# Patient Record
Sex: Female | Born: 1995 | Race: Black or African American | Hispanic: No | Smoking: Never smoker
Health system: Southern US, Community
[De-identification: ages and names within clinical notes are randomized; demographics above are authoritative.]

## PROBLEM LIST (undated history)

## (undated) DIAGNOSIS — U071 COVID-19: Secondary | ICD-10-CM

## (undated) DIAGNOSIS — Z789 Other specified health status: Secondary | ICD-10-CM

## (undated) DIAGNOSIS — F319 Bipolar disorder, unspecified: Secondary | ICD-10-CM

## (undated) DIAGNOSIS — F419 Anxiety disorder, unspecified: Secondary | ICD-10-CM

## (undated) HISTORY — PX: TONSILLECTOMY: SUR1361

## (undated) HISTORY — PX: NO PAST SURGERIES: SHX2092

---
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
## 2017-05-02 ENCOUNTER — Other Ambulatory Visit: Payer: Self-pay

## 2017-05-02 ENCOUNTER — Encounter: Payer: Self-pay | Admitting: Gynecology

## 2017-05-02 ENCOUNTER — Ambulatory Visit
Admission: EM | Admit: 2017-05-02 | Discharge: 2017-05-02 | Disposition: A | Discharge status: Home / Self Care | Payer: Medicaid Other | Attending: Emergency Medicine | Admitting: Emergency Medicine

## 2017-05-02 DIAGNOSIS — J029 Acute pharyngitis, unspecified: Secondary | ICD-10-CM | POA: Diagnosis not present

## 2017-05-02 DIAGNOSIS — F419 Anxiety disorder, unspecified: Secondary | ICD-10-CM | POA: Diagnosis not present

## 2017-05-02 DIAGNOSIS — F319 Bipolar disorder, unspecified: Secondary | ICD-10-CM | POA: Diagnosis not present

## 2017-05-02 HISTORY — DX: Bipolar disorder, unspecified: F31.9

## 2017-05-02 HISTORY — DX: Anxiety disorder, unspecified: F41.9

## 2017-05-02 LAB — CBC WITH DIFFERENTIAL/PLATELET
Basophils Absolute: 0.1 10*3/uL (ref 0–0.1)
Basophils Relative: 1 %
EOS ABS: 0.1 10*3/uL (ref 0–0.7)
EOS PCT: 1 %
HCT: 40.6 % (ref 35.0–47.0)
Hemoglobin: 13.8 g/dL (ref 12.0–16.0)
LYMPHS ABS: 1.8 10*3/uL (ref 1.0–3.6)
Lymphocytes Relative: 14 %
MCH: 27.9 pg (ref 26.0–34.0)
MCHC: 33.8 g/dL (ref 32.0–36.0)
MCV: 82.6 fL (ref 80.0–100.0)
MONO ABS: 0.9 10*3/uL (ref 0.2–0.9)
MONOS PCT: 7 %
Neutro Abs: 10.5 10*3/uL — ABNORMAL HIGH (ref 1.4–6.5)
Neutrophils Relative %: 77 %
PLATELETS: 347 10*3/uL (ref 150–440)
RBC: 4.92 MIL/uL (ref 3.80–5.20)
RDW: 13.3 % (ref 11.5–14.5)
WBC: 13.4 10*3/uL — AB (ref 3.6–11.0)

## 2017-05-02 LAB — MONONUCLEOSIS SCREEN: MONO SCREEN: NEGATIVE

## 2017-05-02 LAB — RAPID STREP SCREEN (MED CTR MEBANE ONLY): Streptococcus, Group A Screen (Direct): NEGATIVE

## 2017-05-02 MED ORDER — IBUPROFEN 600 MG PO TABS: 600.0000 mg | ORAL_TABLET | Freq: Four times a day (QID) | ORAL | 0 refills | Status: DC | PRN

## 2017-05-02 NOTE — ED Provider Notes (Signed)
HPI  SUBJECTIVE:  Patient reports sore throat starting 1 week ago. Sx worse with swallowing.  Sx better with nothing. Finished Augmentin several days ago for sinusitis.  Has also tried salt water gargles. 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  No history of mono, diabetes, hypertension, HIV.  LMP: At the end of February.  Denies the possibility being pregnant.  PMD: [facility].   Past Medical History:  Diagnosis Date  . Anxiety   . Bipolar 1 disorder (HCC)     History reviewed. No pertinent surgical history.  History reviewed. No pertinent family history.  Social History   Tobacco Use  . Smoking status: Never Smoker  . Smokeless tobacco: Never Used  Substance Use Topics  . Alcohol use: No    Frequency: Never  . Drug use: No    No current facility-administered medications for this encounter.   Current Outpatient Medications:  .  lamoTRIgine (LAMICTAL) 100 MG tablet, Take 100 mg by mouth daily., Disp: , Rfl:  .  sertraline (ZOLOFT) 100 MG tablet, Take 100 mg by mouth daily., Disp: , Rfl:  .  ibuprofen (ADVIL,MOTRIN) 600 MG tablet, Take 1 tablet (600 mg total) by mouth every 6 (six) hours as needed., Disp: 30 tablet, Rfl: 0  No Known Allergies   ROS  As noted in HPI.   Physical Exam  BP 140/89 (BP Location: Right Arm)   Pulse 94   Temp 98.8 F (37.1 C) (Oral)   Resp 16   Wt 177 lb (80.3 kg)   LMP 04/21/2017   SpO2 99%   Constitutional: Well developed, well nourished, no acute distress Eyes:  EOMI, conjunctiva normal bilaterally HENT: Normocephalic, atraumatic,mucus membranes moist.  - nasal congestion +erythematous oropharynx +enlarged tonsils + exudates.  Uvula midline.  Respiratory: Normal inspiratory effort Cardiovascular: Normal rate, no murmurs, rubs, gallops GI: nondistended, nontender. No appreciable splenomegaly skin: No rash, skin intact Lymph: + cervical LN  Musculoskeletal: no deformities Neurologic: Alert & oriented x 3, no focal neuro deficits Psychiatric: Speech and behavior appropriate.   ED Course   Medications - No data to display  Orders Placed This Encounter  Procedures  . Rapid strep screen    Standing Status:   Standing    Number of Occurrences:   1  . Culture, group A strep    Standing Status:   Standing    Number of Occurrences:   1  . CBC with Differential    Standing Status:   Standing    Number of Occurrences:   1  . Mononucleosis screen    Standing Status:   Standing    Number of Occurrences:   1    Results for orders placed or performed during the hospital encounter of 05/02/17 (from the past 24 hour(s))  Rapid strep screen     Status: None   Collection Time: 05/02/17  1:38 PM  Result Value Ref Range   Streptococcus, Group A Screen (Direct) NEGATIVE NEGATIVE  CBC with Differential     Status: Abnormal   Collection Time: 05/02/17  4:04 PM  Result Value Ref Range   WBC 13.4 (H) 3.6 - 11.0 K/uL  RBC 4.92 3.80 - 5.20 MIL/uL   Hemoglobin 13.8 12.0 - 16.0 g/dL   HCT 96.040.6 45.435.0 - 09.847.0 %   MCV 82.6 80.0 - 100.0 fL   MCH 27.9 26.0 - 34.0 pg   MCHC 33.8 32.0 - 36.0 g/dL   RDW 11.913.3 14.711.5 - 82.914.5 %   Platelets 347 150 - 440 K/uL   Neutrophils Relative % 77 %   Neutro Abs 10.5 (H) 1.4 - 6.5 K/uL   Lymphocytes Relative 14 %   Lymphs Abs 1.8 1.0 - 3.6 K/uL   Monocytes Relative 7 %   Monocytes Absolute 0.9 0.2 - 0.9 K/uL   Eosinophils Relative 1 %   Eosinophils Absolute 0.1 0 - 0.7 K/uL   Basophils Relative 1 %   Basophils Absolute 0.1 0 - 0.1 K/uL  Mononucleosis screen     Status: None   Collection Time: 05/02/17  4:04 PM  Result Value Ref Range   Mono Screen NEGATIVE NEGATIVE   No results  found.  ED Clinical Impression  Exudative pharyngitis   ED Assessment/Plan  Rapid strep negative.  She has an exudative pharyngitis so we will check a CBC, Monospot.  Also sending off a throat culture.  Anticipate this to be negative because she just finished a course of Augmentin.  we will contact to the patient and call in the appropriate antibiotics if this comes back positive for infection requiring antibiotics.  Patient home with ibuprofen, Tylenol, Benadryl/Maalox mixture. Patient to followup with PMD if not better in a week.  The ER if she gets worse.  With a leukocytosis however Monospot is negative.  Patient with an exudative pharyngitis, unknown etiology.  Plan as above.    Discussed labs,  MDM, plan and followup with patient.  patient agrees with plan.   Meds ordered this encounter  Medications  . ibuprofen (ADVIL,MOTRIN) 600 MG tablet    Sig: Take 1 tablet (600 mg total) by mouth every 6 (six) hours as needed.    Dispense:  30 tablet    Refill:  0     *This clinic note was created using Scientist, clinical (histocompatibility and immunogenetics)Dragon dictation software. Therefore, there may be occasional mistakes despite careful proofreading.    Domenick GongMortenson, Leeana Creer, MD 05/03/17 [phone]

## 2017-05-02 NOTE — Discharge Instructions (Signed)
your rapid strep was negative today, so we have sent off a throat culture.  We will contact you and call in the appropriate antibiotics if your culture comes back positive for an infection requiring antibiotic treatment.  Give us a working phone number.  Your Monospot also came back negative, which is surprising.  1 gram of Tylenol and 600 mg ibuprofen together 3-4 times a day as needed for pain.  Make sure you drink plenty of extra fluids.  Some people find salt water gargles and  Traditional Medicinal's "Throat Coat" tea helpful. Take 5 mL of liquid Benadryl and 5 mL of Maalox. Mix it together, and then hold it in your mouth for as long as you can and then swallow. You may do this 4 times a day.   Follow-up with your primary care physician if you are not better in a week to retest for mono.  Go immediately to the ER if you get worse, have difficulty breathing, cannot swallow your saliva because your throat is swelling shut, or for other concerns.  Go to www.goodrx.com to look up your medications. This will give you a list of where you can find your prescriptions at the most affordable prices. Or ask the pharmacist what the cash price is, or if they have any other discount programs available to help make your medication more affordable. This can be less expensive than what you would pay with insurance.

## 2017-05-02 NOTE — ED Triage Notes (Signed)
Per patient sore throat x over 1 week.

## 2017-05-05 LAB — CULTURE, GROUP A STREP (THRC)

## 2018-12-29 ENCOUNTER — Other Ambulatory Visit: Payer: Self-pay | Admitting: *Deleted

## 2018-12-29 DIAGNOSIS — Z20822 Contact with and (suspected) exposure to covid-19: Secondary | ICD-10-CM

## 2018-12-30 LAB — NOVEL CORONAVIRUS, NAA: SARS-CoV-2, NAA: NOT DETECTED

## 2019-07-09 LAB — OB RESULTS CONSOLE HIV ANTIBODY (ROUTINE TESTING): HIV: NONREACTIVE

## 2019-07-09 LAB — OB RESULTS CONSOLE GBS: GBS: POSITIVE

## 2019-07-09 LAB — OB RESULTS CONSOLE GC/CHLAMYDIA
Chlamydia: NEGATIVE
Gonorrhea: NEGATIVE

## 2019-07-09 LAB — OB RESULTS CONSOLE RPR: RPR: NONREACTIVE

## 2020-01-22 DIAGNOSIS — O0993 Supervision of high risk pregnancy, unspecified, third trimester: Secondary | ICD-10-CM | POA: Insufficient documentation

## 2020-01-26 LAB — OB RESULTS CONSOLE HEPATITIS B SURFACE ANTIGEN: Hepatitis B Surface Ag: NEGATIVE

## 2020-01-26 LAB — OB RESULTS CONSOLE VARICELLA ZOSTER ANTIBODY, IGG: Varicella: NON-IMMUNE/NOT IMMUNE

## 2020-01-26 LAB — OB RESULTS CONSOLE RUBELLA ANTIBODY, IGM: Rubella: IMMUNE

## 2020-02-09 ENCOUNTER — Encounter: Payer: Self-pay | Admitting: Emergency Medicine

## 2020-02-09 ENCOUNTER — Ambulatory Visit
Admission: EM | Admit: 2020-02-09 | Discharge: 2020-02-09 | Disposition: A | Discharge status: Home / Self Care | Payer: Medicaid Other | Attending: Sports Medicine | Admitting: Sports Medicine

## 2020-02-09 ENCOUNTER — Other Ambulatory Visit: Payer: Self-pay

## 2020-02-09 DIAGNOSIS — R059 Cough, unspecified: Secondary | ICD-10-CM | POA: Diagnosis not present

## 2020-02-09 DIAGNOSIS — R0789 Other chest pain: Secondary | ICD-10-CM | POA: Diagnosis not present

## 2020-02-09 DIAGNOSIS — Z20822 Contact with and (suspected) exposure to covid-19: Secondary | ICD-10-CM | POA: Insufficient documentation

## 2020-02-09 DIAGNOSIS — J069 Acute upper respiratory infection, unspecified: Secondary | ICD-10-CM | POA: Diagnosis not present

## 2020-02-09 LAB — RESP PANEL BY RT-PCR (FLU A&B, COVID) ARPGX2
Influenza A by PCR: NEGATIVE
Influenza B by PCR: NEGATIVE
SARS Coronavirus 2 by RT PCR: NEGATIVE

## 2020-02-09 NOTE — ED Triage Notes (Signed)
Patient c/o cough, chest congestion and SOB that started on Monday.  Patient denies fevers.  

## 2020-02-09 NOTE — Discharge Instructions (Addendum)
Patient should follow-up here, with PCP, or in an ER setting if her symptoms were to worsen or not improve.

## 2020-02-09 NOTE — ED Provider Notes (Signed)
[facility]    CSN: [id number] Arrival date & time: 02/09/20  1708      History   Chief Complaint Chief Complaint  Patient presents with  . Cough    HPI Belinda Weber is a 24 y.o. female.   24 year old female who presents for evaluation of 5 days of cough and congestion.  She reports a little bit of chest tightness.  When she coughs she is not able to cough up anything of significant production.  She denies any fever shakes chills.  No nausea vomiting diarrhea.  She denies any Covid exposure although she does work at a daycare.  She has not been vaccinated against Covid or influenza.  She has been taking some Robitussin over-the-counter which has had limited success with her cough.  No red flag signs or symptoms noted on history.     Past Medical History:  Diagnosis Date  . Anxiety   . Bipolar 1 disorder (HCC)     There are no problems to display for this patient.   History reviewed. No pertinent surgical history.  OB History    Gravida  1   Para      Term      Preterm      AB      Living        SAB      IAB      Ectopic      Multiple      Live Births               Home Medications    Prior to Admission medications   Medication Sig Start Date End Date Taking? Authorizing Provider  ibuprofen (ADVIL,MOTRIN) 600 MG tablet Take 1 tablet (600 mg total) by mouth every 6 (six) hours as needed. 05/02/17   Domenick Gong, MD  lamoTRIgine (LAMICTAL) 100 MG tablet Take 100 mg by mouth daily.    [provider]  sertraline (ZOLOFT) 100 MG tablet Take 100 mg by mouth daily.    [provider]    Family History History reviewed. No pertinent family history.  Social History Social History   Tobacco Use  . Smoking status: Never Smoker  . Smokeless tobacco: Never Used  Vaping Use  . Vaping Use: Never used  Substance Use Topics  . Alcohol use: No  . Drug use: No     Allergies   Patient has no known  allergies.   Review of Systems Review of Systems  Constitutional: Negative for activity change, appetite change, chills, fatigue and fever.  HENT: Positive for congestion. Negative for ear discharge, ear pain, sinus pressure, sinus pain and sore throat.   Respiratory: Positive for cough and shortness of breath. Negative for chest tightness, wheezing and stridor.   Cardiovascular: Negative for chest pain.  Gastrointestinal: Negative for abdominal pain.  Genitourinary: Negative for dysuria, flank pain and hematuria.  Skin: Negative for color change, pallor, rash and wound.     Physical Exam Triage Vital Signs ED Triage Vitals  Enc Vitals Group     BP 02/09/20 1744 125/83     Pulse Rate 02/09/20 1744 83     Resp 02/09/20 1744 15     Temp 02/09/20 1744 98.7 F (37.1 C)     Temp Source 02/09/20 1744 Oral     SpO2 02/09/20 1744 100 %     Weight 02/09/20 1740 205 lb (93 kg)     Height 02/09/20 1740 5\' 6"  (1.676 m)  Head Circumference --      Peak Flow --      Pain Score 02/09/20 1740 7     Pain Loc --      Pain Edu? --      Excl. in GC? --    No data found.  Updated Vital Signs BP 125/83 (BP Location: Right Arm)   Pulse 83   Temp 98.7 F (37.1 C) (Oral)   Resp 15   Ht 5\' 6"  (1.676 m)   Wt 93 kg   SpO2 100%   BMI 33.09 kg/m   Visual Acuity Right Eye Distance:   Left Eye Distance:   Bilateral Distance:    Right Eye Near:   Left Eye Near:    Bilateral Near:     Physical Exam  General: Alert, pleasant, no acute distress.  Nontoxic-appearing. HEENT pupils are equal reactive to light, extraocular m 9 uscles are intact.  No evidence of any conjunctival injection.  Normal cephalic atraumatic.  Oropharynx is moist clear no exudate or erythema. Neck: Full range of motion supple with some mild cervical lymphadenopathy. Cardiac regular rate and rhythm without murmurs gallops or rubs.  Pulses are normal. Lungs: Normal respiratory effort.  Breath sounds are equal  bilaterally.  There is no wheezes rales stridor or rhonchi. Skin warm dry no evidence of any erythema or rash.  Less than 2-second capillary refill.  UC Treatments / Results  Labs (all labs ordered are listed, but only abnormal results are displayed) Labs Reviewed  RESP PANEL BY RT-PCR (FLU A&B, COVID) ARPGX2    EKG   Radiology No results found.  Procedures Procedures (including critical care time)  Medications Ordered in UC Medications - No data to display  Initial Impression / Assessment and Plan / UC Course  I have reviewed the triage vital signs and the nursing notes.  Pertinent labs & imaging results that were available during my care of the patient were reviewed by me and considered in my medical decision making (see chart for details).  Clinical Course as of 02/09/20 1849  Fri Feb 09, 2020  1818 Resp Panel by RT-PCR (Flu A&B, Covid) Nasopharyngeal Swab [KB]    Clinical Course User Index [KB] Feb 11, 2020, MD    Findings and treatment plan were discussed detail with the patient.  Patient was in agreement.  Voiced verbal understanding.  Given that she is Covid negative as well as influenza negative I have recommended just supportive care.  This should include Mucinex, over-the-counter cough medicine, Tylenol or Motrin for fever discomfort and relative rest.  I offered her a work note she did not need one.  Patient should follow-up here, with PCP, or in an ER setting if her symptoms were to worsen or not improve. Final Clinical Impressions(s) / UC Diagnoses   Final diagnoses:  Cough  Upper respiratory tract infection, unspecified type     Discharge Instructions     Patient should follow-up here, with PCP, or in an ER setting if her symptoms were to worsen or not improve.    ED Prescriptions    None     PDMP not reviewed this encounter.   Delton See, MD 02/09/20 1850

## 2020-03-05 ENCOUNTER — Ambulatory Visit
Admission: EM | Admit: 2020-03-05 | Discharge: 2020-03-05 | Discharge status: Absent without leave | Payer: Medicaid Other

## 2020-03-05 ENCOUNTER — Other Ambulatory Visit: Payer: Self-pay

## 2020-04-20 ENCOUNTER — Observation Stay
Admission: EM | Admit: 2020-04-20 | Discharge: 2020-04-20 | Disposition: A | Discharge status: Home / Self Care | Payer: Medicaid Other | Attending: Obstetrics and Gynecology | Admitting: Obstetrics and Gynecology

## 2020-04-20 ENCOUNTER — Other Ambulatory Visit: Payer: Self-pay

## 2020-04-20 ENCOUNTER — Encounter: Payer: Self-pay | Admitting: Intensive Care

## 2020-04-20 DIAGNOSIS — Z3A24 24 weeks gestation of pregnancy: Secondary | ICD-10-CM

## 2020-04-20 DIAGNOSIS — Z041 Encounter for examination and observation following transport accident: Secondary | ICD-10-CM | POA: Diagnosis present

## 2020-04-20 DIAGNOSIS — M545 Low back pain, unspecified: Secondary | ICD-10-CM

## 2020-04-20 LAB — URINALYSIS, COMPLETE (UACMP) WITH MICROSCOPIC
Bilirubin Urine: NEGATIVE
Glucose, UA: NEGATIVE mg/dL
Hgb urine dipstick: NEGATIVE
Ketones, ur: 5 mg/dL — AB
Leukocytes,Ua: NEGATIVE
Nitrite: NEGATIVE
Protein, ur: NEGATIVE mg/dL
Specific Gravity, Urine: 1.017 (ref 1.005–1.030)
pH: 6 (ref 5.0–8.0)

## 2020-04-20 MED ORDER — ACETAMINOPHEN 325 MG PO TABS
650.0000 mg | ORAL_TABLET | ORAL | Status: DC | PRN
Start: 2020-04-20 — End: 2020-04-21
  Administered 2020-04-20: 650 mg via ORAL
  Filled 2020-04-20: qty 2

## 2020-04-20 MED ORDER — OXYCODONE-ACETAMINOPHEN 5-325 MG PO TABS: 1.0000 | ORAL_TABLET | ORAL | Status: DC | PRN
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
## 2020-04-20 NOTE — Discharge Instructions (Signed)
Follow-up with [facility] clinic OB/GYN per their recommendations given on the L&D floor.  Take Tylenol as needed for pain.

## 2020-06-17 ENCOUNTER — Observation Stay
Admission: EM | Admit: 2020-06-17 | Discharge: 2020-06-17 | Disposition: A | Discharge status: Home / Self Care | Payer: Medicaid Other | Attending: Obstetrics and Gynecology | Admitting: Obstetrics and Gynecology

## 2020-06-17 ENCOUNTER — Other Ambulatory Visit: Payer: Self-pay

## 2020-06-17 ENCOUNTER — Encounter: Payer: Self-pay | Admitting: Obstetrics and Gynecology

## 2020-06-17 DIAGNOSIS — O4703 False labor before 37 completed weeks of gestation, third trimester: Secondary | ICD-10-CM | POA: Diagnosis present

## 2020-06-17 DIAGNOSIS — F819 Developmental disorder of scholastic skills, unspecified: Secondary | ICD-10-CM | POA: Insufficient documentation

## 2020-06-17 DIAGNOSIS — E669 Obesity, unspecified: Secondary | ICD-10-CM | POA: Diagnosis not present

## 2020-06-17 DIAGNOSIS — O99213 Obesity complicating pregnancy, third trimester: Secondary | ICD-10-CM | POA: Diagnosis not present

## 2020-06-17 DIAGNOSIS — Z3A32 32 weeks gestation of pregnancy: Secondary | ICD-10-CM | POA: Diagnosis not present

## 2020-06-17 DIAGNOSIS — O26842 Uterine size-date discrepancy, second trimester: Secondary | ICD-10-CM | POA: Insufficient documentation

## 2020-06-17 DIAGNOSIS — Z2839 Other underimmunization status: Secondary | ICD-10-CM | POA: Insufficient documentation

## 2020-06-17 HISTORY — DX: Other specified health status: Z78.9

## 2020-06-17 LAB — URINALYSIS, COMPLETE (UACMP) WITH MICROSCOPIC
Bilirubin Urine: NEGATIVE
Glucose, UA: NEGATIVE mg/dL
Hgb urine dipstick: NEGATIVE
Ketones, ur: NEGATIVE mg/dL
Leukocytes,Ua: NEGATIVE
Nitrite: NEGATIVE
Protein, ur: NEGATIVE mg/dL
Specific Gravity, Urine: 1.011 (ref 1.005–1.030)
pH: 7 (ref 5.0–8.0)

## 2020-06-17 LAB — WET PREP, GENITAL
Clue Cells Wet Prep HPF POC: NONE SEEN
Sperm: NONE SEEN
Trich, Wet Prep: NONE SEEN
Yeast Wet Prep HPF POC: NONE SEEN

## 2020-06-17 LAB — FETAL FIBRONECTIN: Fetal Fibronectin: NEGATIVE

## 2020-06-17 NOTE — Discharge Summary (Signed)
Belinda Weber is a 25 y.o. female. She is at [redacted]w[redacted]d gestation. Patient's last menstrual period was 11/01/2019 (exact date). Estimated Date of Delivery: 08/07/20  Prenatal care site: [facility]  Chief complaint: sent from office for PTL precautions   HPI: patient reports that she's been having cramping on and off for the past several weeks but has gotten worse over the past couple of days. She had a routine prenatal visit today and was sent for fetal monitoring and FFN for further evaluation of symptoms.   Factors complicating pregnancy: 1. Elevated 1hr GTT - 3hr WNL 2. Obesity in pregnancy  3. Varicella non-immune 4. Possible velamentous cord insertion - not seen on follow up US  5. History of anxiety and depression    S: Resting comfortably, no VB.no LOF,  Active fetal movement.   Maternal Medical History:  Past Medical Hx:  has a past medical history of Anxiety, Bipolar 1 disorder (HCC), and Medical history non-contributory.    Past Surgical Hx:  has a past surgical history that includes No past surgeries.   No Known Allergies   Prior to Admission medications   Medication Sig Start Date End Date Taking? Authorizing Provider  Prenatal Vit-Fe Fumarate-FA (PRENATAL MULTIVITAMIN) TABS tablet Take 1 tablet by mouth daily at 12 noon.   Yes [provider]  ibuprofen (ADVIL,MOTRIN) 600 MG tablet Take 1 tablet (600 mg total) by mouth every 6 (six) hours as needed. Patient not taking: Reported on 06/17/2020 05/02/17   Domenick Gong, MD  lamoTRIgine (LAMICTAL) 100 MG tablet Take 100 mg by mouth daily. Patient not taking: Reported on 06/17/2020    [provider]  sertraline (ZOLOFT) 100 MG tablet Take 100 mg by mouth daily. Patient not taking: Reported on 06/17/2020    [provider]    Social History: She  reports that she has never smoked. She has never used smokeless tobacco. She reports that she does not drink alcohol and does not use  drugs.  Family History: Family history non-contributory, no history of gyn cancers  Review of Systems: A full review of systems was performed and negative except as noted in the HPI.    O:  BP 116/68 (BP Location: Right Arm)   Pulse 90   Temp 98.4 F (36.9 C) (Oral)   Resp 16   Ht 5\' 6"  (1.676 m)   Wt 98.2 kg   LMP 11/01/2019 (Exact Date)   BMI 34.93 kg/m  Results for orders placed or performed during the hospital encounter of 06/17/20 (from the past 48 hour(s))  Wet prep, genital   Collection Time: 06/17/20 12:45 PM   Specimen: Vaginal  Result Value Ref Range   Yeast Wet Prep HPF POC NONE SEEN NONE SEEN   Trich, Wet Prep NONE SEEN NONE SEEN   Clue Cells Wet Prep HPF POC NONE SEEN NONE SEEN   WBC, Wet Prep HPF POC FEW (A) NONE SEEN   Sperm NONE SEEN   Fetal fibronectin   Collection Time: 06/17/20 12:45 PM  Result Value Ref Range   Fetal Fibronectin NEGATIVE NEGATIVE  Urinalysis, Complete w Microscopic Vaginal   Collection Time: 06/17/20 12:45 PM  Result Value Ref Range   Color, Urine YELLOW (A) YELLOW   APPearance HAZY (A) CLEAR   Specific Gravity, Urine 1.011 1.005 - 1.030   pH 7.0 5.0 - 8.0   Glucose, UA NEGATIVE NEGATIVE mg/dL   Hgb urine dipstick NEGATIVE NEGATIVE   Bilirubin Urine NEGATIVE NEGATIVE   Ketones, ur  NEGATIVE NEGATIVE mg/dL   Protein, ur NEGATIVE NEGATIVE mg/dL   Nitrite NEGATIVE NEGATIVE   Leukocytes,Ua NEGATIVE NEGATIVE   WBC, UA 0-5 0 - 5 WBC/hpf   Bacteria, UA RARE (A) NONE SEEN   Squamous Epithelial / LPF 0-5 0 - 5     Constitutional: NAD, AAOx3  CV: RRR PULM: nl respiratory effort  Abd: gravid, non-tender, non-distended, soft Ext: Non-tender, Nonedmeatous  Psych: mood appropriate, speech normal SVE: Dilation: Fingertip Effacement (%): Thick Cervical Position: Middle Exam by:: Vedha Tercero CNM   Fetal Monitor: Baseline: 135 bpm Variability: moderate Accels: Present Decels: none Toco: Occasional mild contractions, 30 sec in duration    Category: I   Assessment: 25 y.o. [redacted]w[redacted]d here for antenatal surveillance during pregnancy.  Principle diagnosis: Preterm contractions    Plan:  Preterm labor: not present.   Mild contractions resolved with rest in hydration  Fetal Wellbeing: Reassuring Cat 1 tracing.  Reactive NST   PTL precautions reviewed.  Instructed to return to L&D for concerns or worsening symptoms.   D/c home stable, precautions reviewed, follow-up as scheduled.   ----- Margaretmary Eddy, CNM Certified Nurse Midwife [facility]

## 2020-06-17 NOTE — Progress Notes (Signed)
Discharge instructions reviewed with patient, patient verbalizes understanding and states that she does not have any questions. Patient discharged home with significant other, she was ambulatory and in good condition.

## 2020-06-17 NOTE — OB Triage Note (Signed)
Pt is a 25yo G1P0 at [redacted]w[redacted]d that was sent over from [facility] obstetric[facility] for an NST, FFN, and Wet prep. Pt states she has been having braxton hicks ctx since she was 27w but the past two weeks they have become more intense with a lot of pressure in her vagina. EFM applied and initial FHT 150. Pt denies VB, LOF and states positive FM.

## 2020-06-17 NOTE — Progress Notes (Signed)
Mackie CNM in department to evaluate patient. Labs are pending and CNM to bedside shortly to evaluate in person.

## 2020-07-08 ENCOUNTER — Observation Stay
Admission: EM | Admit: 2020-07-08 | Discharge: 2020-07-08 | Disposition: A | Discharge status: Home / Self Care | Payer: Medicaid Other | Attending: Obstetrics and Gynecology | Admitting: Obstetrics and Gynecology

## 2020-07-08 ENCOUNTER — Other Ambulatory Visit: Payer: Self-pay

## 2020-07-08 ENCOUNTER — Encounter: Payer: Self-pay | Admitting: Obstetrics and Gynecology

## 2020-07-08 DIAGNOSIS — O09899 Supervision of other high risk pregnancies, unspecified trimester: Secondary | ICD-10-CM

## 2020-07-08 DIAGNOSIS — Z6832 Body mass index (BMI) 32.0-32.9, adult: Secondary | ICD-10-CM | POA: Diagnosis not present

## 2020-07-08 DIAGNOSIS — Z2839 Other underimmunization status: Secondary | ICD-10-CM

## 2020-07-08 DIAGNOSIS — O403XX1 Polyhydramnios, third trimester, fetus 1: Principal | ICD-10-CM | POA: Insufficient documentation

## 2020-07-08 DIAGNOSIS — Z3A35 35 weeks gestation of pregnancy: Secondary | ICD-10-CM | POA: Diagnosis not present

## 2020-07-08 DIAGNOSIS — O99213 Obesity complicating pregnancy, third trimester: Secondary | ICD-10-CM | POA: Insufficient documentation

## 2020-07-08 DIAGNOSIS — E669 Obesity, unspecified: Secondary | ICD-10-CM | POA: Diagnosis not present

## 2020-07-08 DIAGNOSIS — O99810 Abnormal glucose complicating pregnancy: Secondary | ICD-10-CM | POA: Diagnosis not present

## 2020-07-08 DIAGNOSIS — O403XX0 Polyhydramnios, third trimester, not applicable or unspecified: Secondary | ICD-10-CM | POA: Diagnosis present

## 2020-07-08 NOTE — Discharge Instructions (Signed)
Keep your next scheduled follow up appointment. Call your provider for any other concerns.

## 2020-07-08 NOTE — OB Triage Note (Signed)
Patient sent from the office for non reactive NST

## 2020-07-08 NOTE — Discharge Summary (Signed)
Belinda Weber is a 25 y.o. female. She is at [redacted]w[redacted]d gestation. Patient's last menstrual period was 11/01/2019 (exact date). Estimated Date of Delivery: 08/07/20  Prenatal care site: [facility]  Current pregnancy complicated by:  1. Polyhydramnios, AFI 28.5 on 5/16 2. Elevated 1hr GTT  05/17/2020 - 1hr GTT 140  3hr GTT 05/23/2020 - 72, 137, 90, 108 3. Varicella Non-Immune  Advise vaccine post partum 4. Obesity BMI: 32.89 5. H/o mental health diagnoses  Dx: Anxiety and Depression  Chief complaint: very uncomfortable,pressure, UCs. Sent from office for NR NST in office.    S: Resting comfortably. no CTX, no VB.no LOF,  Active fetal movement. Denies: HA, visual changes, SOB, or RUQ/epigastric pain  Maternal Medical History:   Past Medical History:  Diagnosis Date  . Anxiety   . Bipolar 1 disorder (HCC)   . Medical history non-contributory     Past Surgical History:  Procedure Laterality Date  . NO PAST SURGERIES      Allergies  Allergen Reactions  . Paxil [Paroxetine]     Prior to Admission medications   Medication Sig Start Date End Date Taking? Authorizing Provider  Prenatal Vit-Fe Fumarate-FA (PRENATAL MULTIVITAMIN) TABS tablet Take 1 tablet by mouth daily at 12 noon.   Yes [provider]  ibuprofen (ADVIL,MOTRIN) 600 MG tablet Take 1 tablet (600 mg total) by mouth every 6 (six) hours as needed. Patient not taking: Reported on 06/17/2020 05/02/17   Domenick Gong, MD  lamoTRIgine (LAMICTAL) 100 MG tablet Take 100 mg by mouth daily. Patient not taking: Reported on 06/17/2020    [provider]  sertraline (ZOLOFT) 100 MG tablet Take 100 mg by mouth daily. Patient not taking: Reported on 06/17/2020    [provider]      Social History: She  reports that she has never smoked. She has never used smokeless tobacco. She reports that she does not drink alcohol and does not use drugs.  Family History:  no history of gyn  cancers  Review of Systems: A full review of systems was performed and negative except as noted in the HPI.     O:  BP 109/68 (BP Location: Right Arm)   Pulse 99   Temp 98.8 F (37.1 C) (Oral)   Resp 18   LMP 11/01/2019 (Exact Date)  No results found for this or any previous visit (from the past 48 hour(s)).   Constitutional: NAD, AAOx3  HE/ENT: extraocular movements grossly intact, moist mucous membranes CV: RRR PULM: nl respiratory effort, CTABL     Abd: gravid, non-tender, non-distended, soft      Ext: Non-tender, Nonedematous   Psych: mood appropriate, speech normal Pelvic: deferred  Fetal  monitoring: Cat I Appropriate for GA Baseline: 140bpm Variability: moderate Accelerations:  present x >2 Decelerations absent Time    A/P: 25 y.o. [redacted]w[redacted]d here for antenatal surveillance for Polyhydramnios and non-reactive NST in office today.   Principle Diagnosis:  Polyhydramnios in pregnancy   Labor: not present.   Fetal Wellbeing: Reassuring Cat 1 tracing with Reactive NST   D/c home stable, precautions reviewed, follow-up as scheduled.    Randa Ngo, CNM 07/08/2020 6:22 PM

## 2020-07-18 ENCOUNTER — Other Ambulatory Visit: Payer: Self-pay | Admitting: Obstetrics and Gynecology

## 2020-07-18 DIAGNOSIS — O403XX0 Polyhydramnios, third trimester, not applicable or unspecified: Secondary | ICD-10-CM

## 2020-07-18 NOTE — Progress Notes (Signed)
Dating: EDD: 08/07/20  by LMP: 11/01/19 and c/w [place] at 6+2 wks.   Preg c/b: 1. Polyhydramnios, AFI 30 at 37wks 2. Obesity, BMI 32.89 3. Varicella Non-Immune   Prenatal Labs: Blood type/Rh O Pos  Antibody screen neg  Rubella Immune  Varicella NON-Immune  RPR NR  HBsAg Neg  HIV NR  GC neg  Chlamydia neg  Genetic screening negative  1 hour GTT 140  3 hour GTT  72-137-90-108  GBS POS    Contraception: undecided Infant feeding: breast Tdap: declined Flu: declined

## 2020-07-30 ENCOUNTER — Other Ambulatory Visit
Admission: RE | Admit: 2020-07-30 | Discharge: 2020-07-30 | Disposition: A | Discharge status: Home / Self Care | Payer: Medicaid Other | Source: Ambulatory Visit | Attending: Obstetrics and Gynecology | Admitting: Obstetrics and Gynecology

## 2020-07-30 ENCOUNTER — Other Ambulatory Visit: Payer: Self-pay

## 2020-07-30 DIAGNOSIS — Z20822 Contact with and (suspected) exposure to covid-19: Secondary | ICD-10-CM | POA: Diagnosis not present

## 2020-07-30 DIAGNOSIS — Z01812 Encounter for preprocedural laboratory examination: Secondary | ICD-10-CM | POA: Insufficient documentation

## 2020-07-30 LAB — SARS CORONAVIRUS 2 (TAT 6-24 HRS): SARS Coronavirus 2: NEGATIVE

## 2020-08-01 ENCOUNTER — Inpatient Hospital Stay: Payer: Medicaid Other | Admitting: Anesthesiology

## 2020-08-01 ENCOUNTER — Other Ambulatory Visit: Payer: Self-pay

## 2020-08-01 ENCOUNTER — Encounter: Payer: Self-pay | Admitting: Obstetrics and Gynecology

## 2020-08-01 ENCOUNTER — Inpatient Hospital Stay: Payer: Medicaid Other

## 2020-08-01 ENCOUNTER — Inpatient Hospital Stay
Admission: EM | Admit: 2020-08-01 | Discharge: 2020-08-04 | DRG: 787 | Disposition: A | Discharge status: Home / Self Care | Payer: Medicaid Other | Attending: Obstetrics and Gynecology | Admitting: Obstetrics and Gynecology

## 2020-08-01 DIAGNOSIS — O99214 Obesity complicating childbirth: Secondary | ICD-10-CM | POA: Diagnosis present

## 2020-08-01 DIAGNOSIS — D62 Acute posthemorrhagic anemia: Secondary | ICD-10-CM | POA: Diagnosis not present

## 2020-08-01 DIAGNOSIS — O9081 Anemia of the puerperium: Secondary | ICD-10-CM | POA: Diagnosis not present

## 2020-08-01 DIAGNOSIS — O403XX0 Polyhydramnios, third trimester, not applicable or unspecified: Secondary | ICD-10-CM | POA: Diagnosis present

## 2020-08-01 DIAGNOSIS — Z20822 Contact with and (suspected) exposure to covid-19: Secondary | ICD-10-CM | POA: Diagnosis present

## 2020-08-01 DIAGNOSIS — O329XX0 Maternal care for malpresentation of fetus, unspecified, not applicable or unspecified: Secondary | ICD-10-CM | POA: Diagnosis present

## 2020-08-01 DIAGNOSIS — O09899 Supervision of other high risk pregnancies, unspecified trimester: Secondary | ICD-10-CM

## 2020-08-01 DIAGNOSIS — O320XX0 Maternal care for unstable lie, not applicable or unspecified: Secondary | ICD-10-CM

## 2020-08-01 DIAGNOSIS — Z3A39 39 weeks gestation of pregnancy: Secondary | ICD-10-CM

## 2020-08-01 LAB — CBC
HCT: 36.3 % (ref 36.0–46.0)
Hemoglobin: 12.3 g/dL (ref 12.0–15.0)
MCH: 28.6 pg (ref 26.0–34.0)
MCHC: 33.9 g/dL (ref 30.0–36.0)
MCV: 84.4 fL (ref 80.0–100.0)
Platelets: 229 10*3/uL (ref 150–400)
RBC: 4.3 MIL/uL (ref 3.87–5.11)
RDW: 14.8 % (ref 11.5–15.5)
WBC: 10.4 10*3/uL (ref 4.0–10.5)
nRBC: 0 % (ref 0.0–0.2)

## 2020-08-01 LAB — COMPREHENSIVE METABOLIC PANEL
ALT: 15 U/L (ref 0–44)
AST: 23 U/L (ref 15–41)
Albumin: 3 g/dL — ABNORMAL LOW (ref 3.5–5.0)
Alkaline Phosphatase: 193 U/L — ABNORMAL HIGH (ref 38–126)
Anion gap: 9 (ref 5–15)
BUN: <5 mg/dL — ABNORMAL LOW (ref 6–20)
CO2: 17 mmol/L — ABNORMAL LOW (ref 22–32)
Calcium: 9.3 mg/dL (ref 8.9–10.3)
Chloride: 109 mmol/L (ref 98–111)
Creatinine, Ser: 0.56 mg/dL (ref 0.44–1.00)
GFR, Estimated: >60 mL/min (ref >60)
Glucose, Bld: 116 mg/dL — ABNORMAL HIGH (ref 70–99)
Potassium: 3.6 mmol/L (ref 3.5–5.1)
Sodium: 135 mmol/L (ref 135–145)
Total Bilirubin: 0.2 mg/dL — ABNORMAL LOW (ref 0.3–1.2)
Total Protein: 6.8 g/dL (ref 6.5–8.1)

## 2020-08-01 LAB — ABO/RH: ABO/RH(D): O POS

## 2020-08-01 LAB — TYPE AND SCREEN
ABO/RH(D): O POS
Antibody Screen: NEGATIVE

## 2020-08-01 LAB — PROTEIN / CREATININE RATIO, URINE
Creatinine, Urine: 101 mg/dL
Protein Creatinine Ratio: 0.16 mg/mg{Cre} — ABNORMAL HIGH (ref 0.00–0.15)
Total Protein, Urine: 16 mg/dL

## 2020-08-01 MED ORDER — LIDOCAINE HCL (PF) 1 % IJ SOLN
INTRAMUSCULAR | Status: AC
  Filled 2020-08-01: qty 30

## 2020-08-01 MED ORDER — FENTANYL 2.5 MCG/ML W/ROPIVACAINE 0.15% IN NS 100 ML EPIDURAL (ARMC)
EPIDURAL | Status: AC
  Filled 2020-08-01: qty 100

## 2020-08-01 MED ORDER — MISOPROSTOL 25 MCG QUARTER TABLET
25.0000 ug | ORAL_TABLET | ORAL | Status: DC | PRN
Start: 2020-08-01 — End: 2020-08-02
  Administered 2020-08-01: 25 ug via BUCCAL
  Filled 2020-08-01: qty 1

## 2020-08-01 MED ORDER — AMMONIA AROMATIC IN INHA
RESPIRATORY_TRACT | Status: AC
  Filled 2020-08-01: qty 10

## 2020-08-01 MED ORDER — LACTATED RINGERS IV SOLN: 500.0000 mL | Freq: Once | INTRAVENOUS | Status: DC

## 2020-08-01 MED ORDER — MISOPROSTOL 25 MCG QUARTER TABLET
25.0000 ug | ORAL_TABLET | ORAL | Status: DC | PRN
  Filled 2020-08-01: qty 1

## 2020-08-01 MED ORDER — OXYTOCIN-SODIUM CHLORIDE 30-0.9 UT/500ML-% IV SOLN
2.5000 [IU]/h | INTRAVENOUS | Status: DC
  Filled 2020-08-01: qty 1000

## 2020-08-01 MED ORDER — OXYTOCIN BOLUS FROM INFUSION: 333.0000 mL | Freq: Once | INTRAVENOUS | Status: DC

## 2020-08-01 MED ORDER — EPHEDRINE 5 MG/ML INJ: 10.0000 mg | INTRAVENOUS | Status: DC | PRN

## 2020-08-01 MED ORDER — FENTANYL 2.5 MCG/ML W/ROPIVACAINE 0.15% IN NS 100 ML EPIDURAL (ARMC)
12.0000 mL/h | EPIDURAL | Status: DC
Start: 2020-08-01 — End: 2020-08-02

## 2020-08-01 MED ORDER — ACETAMINOPHEN 325 MG PO TABS: 650.0000 mg | ORAL_TABLET | ORAL | Status: DC | PRN

## 2020-08-01 MED ORDER — OXYTOCIN-SODIUM CHLORIDE 30-0.9 UT/500ML-% IV SOLN
1.0000 m[IU]/min | INTRAVENOUS | Status: DC
  Administered 2020-08-01: 2 m[IU]/min via INTRAVENOUS
  Administered 2020-08-02: 30 [IU] via INTRAVENOUS
  Filled 2020-08-01: qty 500

## 2020-08-01 MED ORDER — LACTATED RINGERS AMNIOINFUSION
INTRAVENOUS | Status: DC
Start: 2020-08-01 — End: 2020-08-02
  Filled 2020-08-01 (×3): qty 1000

## 2020-08-01 MED ORDER — SODIUM CHLORIDE 0.9 % IV SOLN
5.0000 10*6.[IU] | Freq: Once | INTRAVENOUS | Status: DC
  Filled 2020-08-01: qty 5

## 2020-08-01 MED ORDER — SOD CITRATE-CITRIC ACID 500-334 MG/5ML PO SOLN
30.0000 mL | ORAL | Status: DC | PRN
  Administered 2020-08-01: 30 mL via ORAL
  Filled 2020-08-01: qty 15

## 2020-08-01 MED ORDER — PENICILLIN G POT IN DEXTROSE 60000 UNIT/ML IV SOLN
3.0000 10*6.[IU] | INTRAVENOUS | Status: DC
  Administered 2020-08-01 (×3): 3 10*6.[IU] via INTRAVENOUS
  Filled 2020-08-01 (×3): qty 50

## 2020-08-01 MED ORDER — LIDOCAINE-EPINEPHRINE (PF) 1.5 %-1:200000 IJ SOLN
INTRAMUSCULAR | Status: DC | PRN
  Administered 2020-08-01: 4 mL via EPIDURAL

## 2020-08-01 MED ORDER — BUPIVACAINE HCL (PF) 0.25 % IJ SOLN
INTRAMUSCULAR | Status: DC | PRN
  Administered 2020-08-01: 5 mL via EPIDURAL
  Administered 2020-08-01: 3 mL via EPIDURAL

## 2020-08-01 MED ORDER — TERBUTALINE SULFATE 1 MG/ML IJ SOLN: 0.2500 mg | Freq: Once | INTRAMUSCULAR | Status: DC | PRN

## 2020-08-01 MED ORDER — DIPHENHYDRAMINE HCL 50 MG/ML IJ SOLN: 12.5000 mg | INTRAMUSCULAR | Status: DC | PRN

## 2020-08-01 MED ORDER — LACTATED RINGERS IV SOLN
500.0000 mL | INTRAVENOUS | Status: DC | PRN
  Administered 2020-08-01: 500 mL via INTRAVENOUS

## 2020-08-01 MED ORDER — LIDOCAINE HCL (PF) 1 % IJ SOLN
INTRAMUSCULAR | Status: DC | PRN
Start: 2020-08-01 — End: 2020-08-02
  Administered 2020-08-01: 3 mL via SUBCUTANEOUS

## 2020-08-01 MED ORDER — FENTANYL CITRATE (PF) 100 MCG/2ML IJ SOLN
50.0000 ug | INTRAMUSCULAR | Status: DC | PRN
  Administered 2020-08-01 (×2): 50 ug via INTRAVENOUS
  Filled 2020-08-01 (×2): qty 2

## 2020-08-01 MED ORDER — PHENYLEPHRINE 40 MCG/ML (10ML) SYRINGE FOR IV PUSH (FOR BLOOD PRESSURE SUPPORT): 80.0000 ug | PREFILLED_SYRINGE | INTRAVENOUS | Status: DC | PRN

## 2020-08-01 MED ORDER — TERBUTALINE SULFATE 1 MG/ML IJ SOLN
0.2500 mg | Freq: Once | INTRAMUSCULAR | Status: AC | PRN
  Administered 2020-08-01: 0.25 mg via SUBCUTANEOUS
  Filled 2020-08-01: qty 1

## 2020-08-01 MED ORDER — ONDANSETRON HCL 4 MG/2ML IJ SOLN
4.0000 mg | Freq: Four times a day (QID) | INTRAMUSCULAR | Status: DC | PRN
Start: 2020-08-01 — End: 2020-08-02

## 2020-08-01 MED ORDER — LACTATED RINGERS IV SOLN: INTRAVENOUS | Status: DC

## 2020-08-01 MED ORDER — LIDOCAINE HCL (PF) 1 % IJ SOLN: 30.0000 mL | INTRAMUSCULAR | Status: DC | PRN

## 2020-08-01 MED ORDER — MISOPROSTOL 200 MCG PO TABS
ORAL_TABLET | ORAL | Status: AC
  Administered 2020-08-01: 25 ug via VAGINAL
  Filled 2020-08-01: qty 4

## 2020-08-01 MED ORDER — OXYTOCIN 10 UNIT/ML IJ SOLN
INTRAMUSCULAR | Status: AC
  Filled 2020-08-01: qty 2

## 2020-08-01 MED ORDER — FENTANYL 2.5 MCG/ML W/ROPIVACAINE 0.15% IN NS 100 ML EPIDURAL (ARMC)
EPIDURAL | Status: DC | PRN
  Administered 2020-08-01: 12 mL/h via EPIDURAL

## 2020-08-01 NOTE — Progress Notes (Signed)
Belinda Weber is a 25 y.o. G1P0 at [redacted]w[redacted]d by LMP with IOL for poly - comfortable with epidural  Subjective: rested  Objective: BP 118/69 (BP Location: Left Arm)   Pulse (!) 115   Temp 98.8 F (37.1 C) (Oral)   Resp 16   Ht 5\' 6"  (1.676 m)   Wt 98.2 kg   LMP 11/01/2019 (Exact Date)   BMI 34.93 kg/m  I/O last 3 completed shifts: In: 2490.6 [I.V.:2390.6; IV Piggyback:100] Out: -  No intake/output data recorded.  FHT:  FHR: 145 bpm, variability: moderate,  accelerations:  Present,  decelerations:  Present +occasional late decels, intermittent variable decels UC:   regular, every 2 minutes SVE:   Dilation: 7 Effacement (%): 70 Station: -1 Exam by:: [email or web address], MD  Labs: Lab Results  Component Value Date   WBC 10.4 08/01/2020   HGB 12.3 08/01/2020   HCT 36.3 08/01/2020   MCV 84.4 08/01/2020   PLT 229 08/01/2020    Assessment / Plan: Pt comfortable with epidural, moderate fetal variability but intermittent decels. She is now making cervical change and the baby is less asynclitic  Labor:  progressing on pitocin Preeclampsia:   n/a Fetal Wellbeing:  Category II - variable decels, amnio infusion started Pain Control:  Epidural I/D:  s/p 2 doses PCN Anticipated MOD:  NSVD. She is aware that we may expedite delivery for persistent fetal intolerance  10/01/2020 08/01/2020, 8:24 PM

## 2020-08-01 NOTE — Anesthesia Preprocedure Evaluation (Signed)
Anesthesia Evaluation  Patient identified by MRN, date of birth, ID band Patient awake    Reviewed: Allergy & Precautions, H&P , NPO status , Patient's Chart, lab work & pertinent test results, reviewed documented beta blocker date and time   Airway Mallampati: II  TM Distance: >3 FB Neck ROM: full    Dental no notable dental hx. (+) Teeth Intact   Pulmonary neg pulmonary ROS, Current Smoker,    Pulmonary exam normal breath sounds clear to auscultation       Cardiovascular Exercise Tolerance: Good negative cardio ROS   Rhythm:regular Rate:Normal     Neuro/Psych PSYCHIATRIC DISORDERS Anxiety Bipolar Disorder negative neurological ROS     GI/Hepatic negative GI ROS, Neg liver ROS,   Endo/Other  negative endocrine ROSdiabetes  Renal/GU      Musculoskeletal   Abdominal   Peds  Hematology negative hematology ROS (+)   Anesthesia Other Findings   Reproductive/Obstetrics (+) Pregnancy                             Anesthesia Physical Anesthesia Plan  ASA: 2  Anesthesia Plan: Epidural   Post-op Pain Management:    Induction:   PONV Risk Score and Plan:   Airway Management Planned:   Additional Equipment:   Intra-op Plan:   Post-operative Plan:   Informed Consent: I have reviewed the patients History and Physical, chart, labs and discussed the procedure including the risks, benefits and alternatives for the proposed anesthesia with the patient or authorized representative who has indicated his/her understanding and acceptance.       Plan Discussed with:   Anesthesia Plan Comments:         Anesthesia Quick Evaluation

## 2020-08-01 NOTE — Progress Notes (Signed)
Labor Progress Note  Belinda Weber is a 25 y.o. G1P0 at [redacted]w[redacted]d by LMP admitted for induction of labor due to Hydramnios.  Subjective: water broken spontaneously   Objective: BP 134/81   Pulse (!) 103   Temp 98.5 F (36.9 C) (Oral)   Resp 17   Ht 5\' 6"  (1.676 m)   Wt 98.2 kg   LMP 11/01/2019 (Exact Date)   BMI 34.93 kg/m  Notable VS details: reviewed  Fetal Assessment: FHT:  FHR: 130 bpm, variability: moderate,  accelerations:  Present,  decelerations:  Present mild variables Category/reactivity:  Category II UC:   regular, every 2-3 minutes SVE:   5/60/-2, soft/posterior  Membrane status:SROM at 1153 Amniotic color: clear  Labs: Lab Results  Component Value Date   WBC 10.4 08/01/2020   HGB 12.3 08/01/2020   HCT 36.3 08/01/2020   MCV 84.4 08/01/2020   PLT 229 08/01/2020    Assessment / Plan: G1 at 39.0wks, IOL for Polyhydramnios  Labor: s/p Cytotec, cook cath. SROM at 1153, pt reports more painful UCs now. Will start Pitocin.  Preeclampsia:   no e/o Pre-E Fetal Wellbeing:  Category II- continue to monitor closely, intermittent variables.  Pain Control:  Labor support without medications I/D:   start GBS prophy, PCN Anticipated MOD:  NSVD  10/01/2020, CNM 08/01/2020, 12:55 PM

## 2020-08-01 NOTE — H&P (Signed)
OB History & Physical   History of Present Illness:  Chief Complaint: IOL  HPI:  Gara Kincade is a 25 y.o. G1P0 female at [redacted]w[redacted]d dated by LMP and c/w [place] at 6+2wks.  She presents to L&D for scheduled IOL due to Polyhydramnios.   Reports active FM, onset of ctx after 1st dose of cytotec given around 0300, denies LOF or VB   Pregnancy Issues: 1. Polyhydramnios, AFI 30 at 37wks 2. Obesity, BMI 32.89 3. Varicella Non-Immune   Maternal Medical History:   Past Medical History:  Diagnosis Date   Anxiety    Bipolar 1 disorder (HCC)    Medical history non-contributory     Past Surgical History:  Procedure Laterality Date   NO PAST SURGERIES      Allergies  Allergen Reactions   Paxil [Paroxetine]     Prior to Admission medications   Medication Sig Start Date End Date Taking? Authorizing Provider  ondansetron (ZOFRAN) 4 MG tablet Take 4 mg by mouth every 8 (eight) hours as needed for nausea or vomiting.   Yes [provider]  Prenatal Vit-Fe Fumarate-FA (PRENATAL MULTIVITAMIN) TABS tablet Take 1 tablet by mouth daily at 12 noon.   Yes [provider]  ibuprofen (ADVIL,MOTRIN) 600 MG tablet Take 1 tablet (600 mg total) by mouth every 6 (six) hours as needed. Patient not taking: Reported on 06/17/2020 05/02/17   Domenick Gong, MD  lamoTRIgine (LAMICTAL) 100 MG tablet Take 100 mg by mouth daily. Patient not taking: Reported on 06/17/2020    [provider]  sertraline (ZOLOFT) 100 MG tablet Take 100 mg by mouth daily. Patient not taking: Reported on 06/17/2020    [provider]     Prenatal care site: [facility]   Social History: She  reports that she has never smoked. She has never used smokeless tobacco. She reports that she does not drink alcohol and does not use drugs.  Family History: no family hx Gyn cancers  Review of Systems: A full review of systems was performed and negative except as noted in the HPI.     Physical  Exam:  Vital Signs: BP 126/79   Pulse (!) 101   Temp 98.8 F (37.1 C) (Oral)   Resp 17   Ht 5\' 6"  (1.676 m)   Wt 98.2 kg   LMP 11/01/2019 (Exact Date)   BMI 34.93 kg/m  General: no acute distress.  HEENT: normocephalic, atraumatic Heart: regular rate & rhythm.  No murmurs/rubs/gallops Lungs: clear to auscultation bilaterally, normal respiratory effort Abdomen: soft, gravid, non-tender;  EFW: 7lbs Pelvic: Cook cath placed, pt tolerated well. 85ml each uterine and vaginal balloons.   External: Normal external female genitalia  Cervix: Dilation: 1 / Effacement (%): 50 /      Extremities: non-tender, symmetric, no edema bilaterally.  DTRs: 2+  Neurologic: Alert & oriented x 3.    Results for orders placed or performed during the hospital encounter of 08/01/20 (from the past 24 hour(s))  CBC     Status: None   Collection Time: 08/01/20 12:24 AM  Result Value Ref Range   WBC 10.4 4.0 - 10.5 K/uL   RBC 4.30 3.87 - 5.11 MIL/uL   Hemoglobin 12.3 12.0 - 15.0 g/dL   HCT 10/01/20 57.3 - 22.0 %   MCV 84.4 80.0 - 100.0 fL   MCH 28.6 26.0 - 34.0 pg   MCHC 33.9 30.0 - 36.0 g/dL   RDW 25.4 27.0 - 62.3 %   Platelets 229  150 - 400 K/uL   nRBC 0.0 0.0 - 0.2 %  Type and screen     Status: None   Collection Time: 08/01/20 12:24 AM  Result Value Ref Range   ABO/RH(D) O POS    Antibody Screen NEG    Sample Expiration      08/04/2020,2359 Performed at [facility], [address] [place], [place] [address]   Comprehensive metabolic panel     Status: Abnormal   Collection Time: 08/01/20 12:24 AM  Result Value Ref Range   Sodium 135 135 - 145 mmol/L   Potassium 3.6 3.5 - 5.1 mmol/L   Chloride 109 98 - 111 mmol/L   CO2 17 (L) 22 - 32 mmol/L   Glucose, Bld 116 (H) 70 - 99 mg/dL   BUN <5 (L) 6 - 20 mg/dL   Creatinine, Ser 2.35 0.44 - 1.00 mg/dL   Calcium 9.3 8.9 - 36.1 mg/dL   Total Protein 6.8 6.5 - 8.1 g/dL   Albumin 3.0 (L) 3.5 - 5.0 g/dL   AST 23 15 - 41 U/L   ALT 15 0 - 44  U/L   Alkaline Phosphatase 193 (H) 38 - 126 U/L   Total Bilirubin 0.2 (L) 0.3 - 1.2 mg/dL   GFR, Estimated >44 >31 mL/min   Anion gap 9 5 - 15  Protein / creatinine ratio, urine     Status: Abnormal   Collection Time: 08/01/20 12:24 AM  Result Value Ref Range   Creatinine, Urine 101 mg/dL   Total Protein, Urine 16 mg/dL   Protein Creatinine Ratio 0.16 (H) 0.00 - 0.15 mg/mg[Cre]  ABO/Rh     Status: None   Collection Time: 08/01/20  1:49 AM  Result Value Ref Range   ABO/RH(D)      O POS Performed at [facility], [address] [place], [place] [address]     Pertinent Results:  Prenatal Labs: Blood type/Rh O Pos  Antibody screen neg  Rubella Immune  Varicella NON-Immune  RPR NR  HBsAg Neg  HIV NR  GC neg  Chlamydia neg  Genetic screening negative  1 hour GTT 140  3 hour GTT  72-137-90-108  GBS POS    FHT: 130bpm, moderate variability, + accels, no decels since review of tracing at 0730 TOCO: irreg q1-3 SVE:  Dilation: 1 / Effacement (%): 50 /      Cephalic by leopolds/US  US OB Limited  Result Date: 08/01/2020 CLINICAL DATA:  Unstable presentation EXAM: LIMITED OBSTETRIC ULTRASOUND COMPARISON:  None. FINDINGS: Number of Fetuses: 1 Heart Rate:  132 bpm Movement: Yes per sonographer exam Presentation: Cephalic Placental Location: Fundal Previa: Not seen Amniotic Fluid :  Increased by measurements AFI: 25 cm BPD: 9.6 cm 39 w  3  d MATERNAL FINDINGS: Cervix:  Could not be visualized Uterus/Adnexae: No abnormality visualized. IMPRESSION: 1. 39 week 3 day gestational age with cephalic presentation. 2. AFI of 25 cm. 3. The cervix was obscured by the fetal head. Electronically Signed   By: Marnee Spring M.D.   On: 08/01/2020 05:05    Assessment:  Katyra Tomassetti is a 25 y.o. G1P0 female at [redacted]w[redacted]d with IOL for polyhydramnios.   Plan:  1. Admit to Labor & Delivery; consents reviewed and obtained - COVID negative on admit.  - Cook cath in place now, will start low  dose pitocin until COok is out, plan AROM.   2. Fetal Well being  - Fetal Tracing: Cat I  - Group B  Streptococcus ppx indicated: positive, will start with active labor or AROM - Presentation: cephalic confirmed by [place]   3. Routine OB: - Prenatal labs reviewed, as above - Rh O Pos - CBC, T&S, RPR on admit - Clear fluids, IVF  4. Induction of Labor -  Contractions: external toco in place -  Pelvis adequate for TOL -  Plan for induction with [facility] cath, pit, AROM -  Plan for continuous fetal monitoring  -  Maternal pain control as desired - Anticipate vaginal delivery  5. Post Partum Planning: - Contraception: undecided Infant feeding: breast Tdap: declined Flu: declined  Randa Ngo, CNM 08/01/20 8:44 AM

## 2020-08-01 NOTE — Progress Notes (Signed)
Labor Progress Note  Belinda Weber is a 25 y.o. G1P0 at [redacted]w[redacted]d by LMP admitted for induction of labor due to Hydramnios.  Subjective: s/p 1 dose of fentanyl for painful UCs. CNM called to bedside for tracing review due to decels.   Objective: BP 130/63   Pulse (!) 110   Temp 98.5 F (36.9 C) (Oral)   Resp 17   Ht 5\' 6"  (1.676 m)   Wt 98.2 kg   LMP 11/01/2019 (Exact Date)   BMI 34.93 kg/m  Notable VS details: reviewed  Fetal Assessment: FHT:  FHR: 130 bpm, variability: moderate,  accelerations:  Present,  decelerations:  Present variables and late decels, position changed and Pitocin decreased per nursing.  Category/reactivity:  Category II UC:   regular, every 1-4 minutes, Pitocin at 89mu/min SVE:   6/50/-1 soft/midposition  Membrane status:SROM at 1153 Amniotic color: clear  Labs: Lab Results  Component Value Date   WBC 10.4 08/01/2020   HGB 12.3 08/01/2020   HCT 36.3 08/01/2020   MCV 84.4 08/01/2020   PLT 229 08/01/2020    Assessment / Plan: G1 at 39.0wks, IOL for Polyhydramnios  Labor: s/p Cytotec, cook cath. SROM at 1153, pt reports more painful UCs now. Pitocin to titrate.  Preeclampsia:   no e/o Pre-E Fetal Wellbeing:  Category II- continue to monitor closely, intermittent variables. Discussed with Dr 10/01/2020 regarding tracing.  Pain Control:  Labor support without medications, IVPM I/D:   start GBS prophy, PCN Anticipated MOD:  NSVD  Dalbert Garnet, CNM 08/01/2020, 3:49 PM

## 2020-08-01 NOTE — Anesthesia Procedure Notes (Signed)
Epidural Patient location during procedure: OB  Staffing Anesthesiologist: Piscitello, Cleda Mccreedy, MD Performed: anesthesiologist   Preanesthetic Checklist Completed: patient identified, IV checked, site marked, risks and benefits discussed, surgical consent, monitors and equipment checked, pre-op evaluation and timeout performed  Epidural Patient position: sitting Prep: ChloraPrep Patient monitoring: heart rate, continuous pulse ox and blood pressure Approach: midline Location: L3-L4 Injection technique: LOR saline  Needle:  Needle type: Tuohy  Needle gauge: 17 G Needle length: 9 cm and 9 Needle insertion depth: 7 cm Catheter type: closed end flexible Catheter size: 19 Gauge Catheter at skin depth: 13 cm Test dose: negative and 1.5% lidocaine with Epi 1:200 K  Assessment Sensory level: T10 Events: blood not aspirated, injection not painful, no injection resistance, no paresthesia and negative IV test  Additional Notes 1st attempt Pt. Evaluated and documentation done after procedure finished. Patient identified. Risks/Benefits/Options discussed with patient including but not limited to bleeding, infection, nerve damage, paralysis, failed block, incomplete pain control, headache, blood pressure changes, nausea, vomiting, reactions to medication both or allergic, itching and postpartum back pain. Confirmed with bedside nurse the patient's most recent platelet count. Confirmed with patient that they are not currently taking any anticoagulation, have any bleeding history or any family history of bleeding disorders. Patient expressed understanding and wished to proceed. All questions were answered. Sterile technique was used throughout the entire procedure. Please see nursing notes for vital signs. Test dose was given through epidural catheter and negative prior to continuing to dose epidural or start infusion. Warning signs of high block given to the patient including shortness of  breath, tingling/numbness in hands, complete motor block, or any concerning symptoms with instructions to call for help. Patient was given instructions on fall risk and not to get out of bed. All questions and concerns addressed with instructions to call with any issues or inadequate analgesia.    Patient tolerated the insertion well without immediate complications.Reason for block:procedure for pain

## 2020-08-01 NOTE — Progress Notes (Signed)
   08/01/20 1215  Clinical Encounter Type  Visited With Patient and family together  Visit Type Initial;Spiritual support  Referral From Chaplain  Consult/Referral To Chaplain  Spiritual Encounters  Spiritual Needs Prayer;Emotional  Chaplain Mahoganie Basher visited LDR -1, Pt Ms. Linus Salmons. Pt 's mother and boyfriend is by her bedside. Pt states she is doing ok and she has dilated 5 centimeters. Pt appeared to be in much discomforted so I provided encouraging words by saying it will not be long now and you are in good hands, and it will be over before you know. I also provided prayer and emotional support.

## 2020-08-01 NOTE — Progress Notes (Signed)
To bedside for discussion of Cat II strip without adequate change. Moderate variability, will continue to try for vaginal delivery. Baby feels asynclitic and high. Her pitocin is at 6 mu/min and MVUs adequate intermittently, but runs of late and variable decels. Pt declines epidural but also declines to move in an attempt to rotate position of fetus. On hands and knees she had accels 15x15 and no decels. She is birthing ball now. I am ok with the Cat II as it is, but if she loses variability or they become consistent, we discussed expedited delivery. I believe that if we could get her into exaggerated SIMs or with ambulation she may have a chance to bring the baby into position at a lower station. Nursing staff continue working with her.

## 2020-08-01 NOTE — Progress Notes (Signed)
Labor Progress Note  Belinda Weber is a 25 y.o. G1P0 at [redacted]w[redacted]d by LMP admitted for induction of labor due to Hydramnios.  Subjective: hurting in back and abdomen with UCs.   Objective: BP 129/68   Pulse (!) 101   Temp 98.4 F (36.9 C) (Oral)   Resp 16   Ht 5\' 6"  (1.676 m)   Wt 98.2 kg   LMP 11/01/2019 (Exact Date)   BMI 34.93 kg/m  Notable VS details: reviewed  Fetal Assessment: FHT:  FHR: 135 bpm, variability: moderate,  accelerations:  Present,  decelerations:  Present variables and late decels, position changed and Pitocin decreased per nursing.  Category/reactivity:  Category II UC:   regular, every 1-4 minutes, Pitocin at 47mu/min, MVUs adequate but not consistently.  SVE:   6/60/-1 soft/midposition - s/p IUPC and FSE at last check.  Membrane status:SROM at 1153 Amniotic color: clear  Labs: Lab Results  Component Value Date   WBC 10.4 08/01/2020   HGB 12.3 08/01/2020   HCT 36.3 08/01/2020   MCV 84.4 08/01/2020   PLT 229 08/01/2020    Assessment / Plan: G1 at 39.0wks, IOL for Polyhydramnios  Labor: s/p Cytotec, cook cath. SROM at 1153, pt reports more painful UCs now. Pitocin titrated.  Preeclampsia:   no e/o Pre-E Fetal Wellbeing:  Category II- continue to monitor closely, intermittent variables. Discussed with Dr 10/01/2020 regarding tracing.  Pain Control:  Labor support without medications, declines nitrous, or epidural.  I/D:   started GBS prophy, PCN  x 2 doses.  Anticipated MOD:  NSVD  Dalbert Garnet, CNM 08/01/2020, 5:52 PM

## 2020-08-02 ENCOUNTER — Encounter: Payer: Self-pay | Admitting: Obstetrics and Gynecology

## 2020-08-02 ENCOUNTER — Encounter
Admission: EM | Disposition: A | Discharge status: Home / Self Care | Payer: Self-pay | Source: Home / Self Care | Attending: Obstetrics and Gynecology

## 2020-08-02 LAB — CREATININE, SERUM
Creatinine, Ser: 0.64 mg/dL (ref 0.44–1.00)
GFR, Estimated: >60 mL/min (ref >60)

## 2020-08-02 LAB — CBC
HCT: 34.6 % — ABNORMAL LOW (ref 36.0–46.0)
Hemoglobin: 11.6 g/dL — ABNORMAL LOW (ref 12.0–15.0)
MCH: 28.3 pg (ref 26.0–34.0)
MCHC: 33.5 g/dL (ref 30.0–36.0)
MCV: 84.4 fL (ref 80.0–100.0)
Platelets: 224 10*3/uL (ref 150–400)
RBC: 4.1 MIL/uL (ref 3.87–5.11)
RDW: 15 % (ref 11.5–15.5)
WBC: 22.3 10*3/uL — ABNORMAL HIGH (ref 4.0–10.5)
nRBC: 0 % (ref 0.0–0.2)

## 2020-08-02 LAB — RPR: RPR Ser Ql: NONREACTIVE

## 2020-08-02 SURGERY — Surgical Case
Anesthesia: Epidural

## 2020-08-02 MED ORDER — DEXAMETHASONE SODIUM PHOSPHATE 10 MG/ML IJ SOLN
INTRAMUSCULAR | Status: DC | PRN
  Administered 2020-08-02: 10 mg via INTRAVENOUS

## 2020-08-02 MED ORDER — SENNOSIDES-DOCUSATE SODIUM 8.6-50 MG PO TABS
2.0000 | ORAL_TABLET | ORAL | Status: DC
  Administered 2020-08-02 – 2020-08-04 (×3): 2 via ORAL
  Filled 2020-08-02 (×3): qty 2

## 2020-08-02 MED ORDER — LACTATED RINGERS IV SOLN: INTRAVENOUS | Status: DC

## 2020-08-02 MED ORDER — ENOXAPARIN SODIUM 60 MG/0.6ML IJ SOSY
0.5000 mg/kg | PREFILLED_SYRINGE | Freq: Once | INTRAMUSCULAR | Status: AC
  Administered 2020-08-03: 50 mg via SUBCUTANEOUS
  Filled 2020-08-02: qty 0.5

## 2020-08-02 MED ORDER — CEFAZOLIN SODIUM-DEXTROSE 2-4 GM/100ML-% IV SOLN
INTRAVENOUS | Status: AC
  Filled 2020-08-02: qty 100

## 2020-08-02 MED ORDER — SODIUM CHLORIDE 0.9 % IV SOLN
INTRAVENOUS | Status: AC
  Filled 2020-08-02: qty 500

## 2020-08-02 MED ORDER — OXYTOCIN-SODIUM CHLORIDE 30-0.9 UT/500ML-% IV SOLN: 2.5000 [IU]/h | INTRAVENOUS | Status: AC

## 2020-08-02 MED ORDER — TETANUS-DIPHTH-ACELL PERTUSSIS 5-2.5-18.5 LF-MCG/0.5 IM SUSY: 0.5000 mL | PREFILLED_SYRINGE | INTRAMUSCULAR | Status: DC | PRN

## 2020-08-02 MED ORDER — ONDANSETRON HCL 4 MG/2ML IJ SOLN
4.0000 mg | Freq: Three times a day (TID) | INTRAMUSCULAR | Status: DC | PRN
Start: 2020-08-02 — End: 2020-08-04

## 2020-08-02 MED ORDER — DIPHENHYDRAMINE HCL 50 MG/ML IJ SOLN: 12.5000 mg | INTRAMUSCULAR | Status: DC | PRN

## 2020-08-02 MED ORDER — KETOROLAC TROMETHAMINE 30 MG/ML IJ SOLN: 30.0000 mg | Freq: Four times a day (QID) | INTRAMUSCULAR | Status: AC | PRN

## 2020-08-02 MED ORDER — SODIUM CHLORIDE (PF) 0.9 % IJ SOLN
INTRAMUSCULAR | Status: AC
  Filled 2020-08-02: qty 50

## 2020-08-02 MED ORDER — NALOXONE HCL 4 MG/10ML IJ SOLN
1.0000 ug/kg/h | INTRAVENOUS | Status: DC | PRN
  Filled 2020-08-02: qty 5

## 2020-08-02 MED ORDER — SIMETHICONE 80 MG PO CHEW: 80.0000 mg | CHEWABLE_TABLET | ORAL | Status: DC | PRN

## 2020-08-02 MED ORDER — NALBUPHINE HCL 10 MG/ML IJ SOLN: 5.0000 mg | Freq: Once | INTRAMUSCULAR | Status: DC | PRN

## 2020-08-02 MED ORDER — FLEET ENEMA 7-19 GM/118ML RE ENEM: 1.0000 | ENEMA | Freq: Every day | RECTAL | Status: DC | PRN

## 2020-08-02 MED ORDER — MENTHOL 3 MG MT LOZG
1.0000 | LOZENGE | OROMUCOSAL | Status: DC | PRN
  Filled 2020-08-02: qty 9

## 2020-08-02 MED ORDER — COCONUT OIL OIL
1.0000 "application " | TOPICAL_OIL | Status: DC | PRN
  Filled 2020-08-02: qty 120

## 2020-08-02 MED ORDER — DIPHENHYDRAMINE HCL 25 MG PO CAPS: 25.0000 mg | ORAL_CAPSULE | ORAL | Status: DC | PRN

## 2020-08-02 MED ORDER — BUPIVACAINE HCL (PF) 0.25 % IJ SOLN
INTRAMUSCULAR | Status: DC | PRN
  Administered 2020-08-02: 20 mL

## 2020-08-02 MED ORDER — ACETAMINOPHEN 500 MG PO TABS
1000.0000 mg | ORAL_TABLET | Freq: Four times a day (QID) | ORAL | Status: DC
  Administered 2020-08-02 – 2020-08-03 (×5): 1000 mg via ORAL
  Filled 2020-08-02 (×6): qty 2

## 2020-08-02 MED ORDER — PRENATAL MULTIVITAMIN CH
1.0000 | ORAL_TABLET | Freq: Every day | ORAL | Status: DC
  Administered 2020-08-02 – 2020-08-03 (×2): 1 via ORAL
  Filled 2020-08-02 (×2): qty 1

## 2020-08-02 MED ORDER — SODIUM CHLORIDE 0.9 % IV SOLN
500.0000 mg | INTRAVENOUS | Status: DC
  Administered 2020-08-02: 250 mg via INTRAVENOUS
  Administered 2020-08-03: 500 mg via INTRAVENOUS
  Filled 2020-08-02 (×2): qty 500

## 2020-08-02 MED ORDER — DIPHENHYDRAMINE HCL 50 MG/ML IJ SOLN
INTRAMUSCULAR | Status: DC | PRN
  Administered 2020-08-02: 12.5 mg via INTRAVENOUS

## 2020-08-02 MED ORDER — MORPHINE SULFATE (PF) 0.5 MG/ML IJ SOLN
INTRAMUSCULAR | Status: DC | PRN
  Administered 2020-08-02: .1 mg via INTRATHECAL

## 2020-08-02 MED ORDER — OXYCODONE HCL 5 MG PO TABS
5.0000 mg | ORAL_TABLET | ORAL | Status: DC | PRN
  Administered 2020-08-02: 5 mg via ORAL
  Administered 2020-08-02: 10 mg via ORAL
  Administered 2020-08-03 (×2): 5 mg via ORAL
  Administered 2020-08-03 (×2): 10 mg via ORAL
  Filled 2020-08-02 (×2): qty 1
  Filled 2020-08-02 (×3): qty 2
  Filled 2020-08-02: qty 1
  Filled 2020-08-02: qty 2

## 2020-08-02 MED ORDER — CARBOPROST TROMETHAMINE 250 MCG/ML IM SOLN
INTRAMUSCULAR | Status: AC
  Filled 2020-08-02: qty 1

## 2020-08-02 MED ORDER — KETOROLAC TROMETHAMINE 30 MG/ML IJ SOLN
30.0000 mg | Freq: Four times a day (QID) | INTRAMUSCULAR | Status: AC | PRN
  Administered 2020-08-02: 30 mg via INTRAVENOUS
  Filled 2020-08-02: qty 1

## 2020-08-02 MED ORDER — FENTANYL CITRATE (PF) 100 MCG/2ML IJ SOLN
INTRAMUSCULAR | Status: AC
  Filled 2020-08-02: qty 2

## 2020-08-02 MED ORDER — FERROUS SULFATE 325 (65 FE) MG PO TABS
325.0000 mg | ORAL_TABLET | Freq: Two times a day (BID) | ORAL | Status: DC
  Administered 2020-08-02 – 2020-08-04 (×5): 325 mg via ORAL
  Filled 2020-08-02 (×5): qty 1

## 2020-08-02 MED ORDER — BISACODYL 10 MG RE SUPP: 10.0000 mg | Freq: Every day | RECTAL | Status: DC | PRN

## 2020-08-02 MED ORDER — METHYLERGONOVINE MALEATE 0.2 MG/ML IJ SOLN
INTRAMUSCULAR | Status: DC | PRN
  Administered 2020-08-02: .2 mg via INTRAMUSCULAR

## 2020-08-02 MED ORDER — DIBUCAINE (PERIANAL) 1 % EX OINT: 1.0000 "application " | TOPICAL_OINTMENT | CUTANEOUS | Status: DC | PRN

## 2020-08-02 MED ORDER — DIPHENHYDRAMINE HCL 25 MG PO CAPS: 25.0000 mg | ORAL_CAPSULE | Freq: Four times a day (QID) | ORAL | Status: DC | PRN

## 2020-08-02 MED ORDER — SODIUM CHLORIDE 0.9% FLUSH: 3.0000 mL | INTRAVENOUS | Status: DC | PRN

## 2020-08-02 MED ORDER — KETOROLAC TROMETHAMINE 30 MG/ML IJ SOLN
30.0000 mg | Freq: Four times a day (QID) | INTRAMUSCULAR | Status: AC
  Administered 2020-08-02 (×3): 30 mg via INTRAVENOUS
  Filled 2020-08-02 (×3): qty 1

## 2020-08-02 MED ORDER — NALBUPHINE HCL 10 MG/ML IJ SOLN
5.0000 mg | INTRAMUSCULAR | Status: DC | PRN
Start: 2020-08-02 — End: 2020-08-04

## 2020-08-02 MED ORDER — BUPIVACAINE IN DEXTROSE 0.75-8.25 % IT SOLN
INTRATHECAL | Status: DC | PRN
  Administered 2020-08-02: 1.5 mL via INTRATHECAL

## 2020-08-02 MED ORDER — BUPIVACAINE LIPOSOME 1.3 % IJ SUSP: 20.0000 mL | Freq: Once | INTRAMUSCULAR | Status: DC

## 2020-08-02 MED ORDER — MORPHINE SULFATE (PF) 0.5 MG/ML IJ SOLN
INTRAMUSCULAR | Status: AC
  Filled 2020-08-02: qty 10

## 2020-08-02 MED ORDER — BUPIVACAINE HCL (PF) 0.5 % IJ SOLN
INTRAMUSCULAR | Status: AC
  Filled 2020-08-02: qty 10

## 2020-08-02 MED ORDER — ACETAMINOPHEN 500 MG PO TABS
1000.0000 mg | ORAL_TABLET | Freq: Four times a day (QID) | ORAL | Status: DC
  Administered 2020-08-02 (×2): 1000 mg via ORAL
  Filled 2020-08-02 (×2): qty 2

## 2020-08-02 MED ORDER — WITCH HAZEL-GLYCERIN EX PADS: 1.0000 "application " | MEDICATED_PAD | CUTANEOUS | Status: DC | PRN

## 2020-08-02 MED ORDER — FENTANYL CITRATE (PF) 100 MCG/2ML IJ SOLN
INTRAMUSCULAR | Status: DC | PRN
  Administered 2020-08-02: 15 ug via EPIDURAL

## 2020-08-02 MED ORDER — SOD CITRATE-CITRIC ACID 500-334 MG/5ML PO SOLN
30.0000 mL | ORAL | Status: AC
  Administered 2020-08-02: 30 mL via ORAL
  Filled 2020-08-02: qty 15

## 2020-08-02 MED ORDER — SIMETHICONE 80 MG PO CHEW
80.0000 mg | CHEWABLE_TABLET | Freq: Three times a day (TID) | ORAL | Status: DC
  Administered 2020-08-02 – 2020-08-04 (×7): 80 mg via ORAL
  Filled 2020-08-02 (×7): qty 1

## 2020-08-02 MED ORDER — SODIUM CHLORIDE 0.9 % IV SOLN
INTRAVENOUS | Status: DC | PRN
  Administered 2020-08-02: 50 ug/min via INTRAVENOUS

## 2020-08-02 MED ORDER — MEASLES, MUMPS & RUBELLA VAC IJ SOLR
0.5000 mL | INTRAMUSCULAR | Status: DC | PRN
  Filled 2020-08-02: qty 0.5

## 2020-08-02 MED ORDER — NALBUPHINE HCL 10 MG/ML IJ SOLN: 5.0000 mg | INTRAMUSCULAR | Status: DC | PRN

## 2020-08-02 MED ORDER — GABAPENTIN 300 MG PO CAPS
300.0000 mg | ORAL_CAPSULE | Freq: Every day | ORAL | Status: DC
  Administered 2020-08-02 – 2020-08-03 (×2): 300 mg via ORAL
  Filled 2020-08-02 (×2): qty 1

## 2020-08-02 MED ORDER — SODIUM CHLORIDE 0.9 % IV SOLN
INTRAVENOUS | Status: DC | PRN
  Administered 2020-08-02: 40 mL

## 2020-08-02 MED ORDER — METHYLERGONOVINE MALEATE 0.2 MG/ML IJ SOLN
INTRAMUSCULAR | Status: AC
  Filled 2020-08-02: qty 1

## 2020-08-02 MED ORDER — ONDANSETRON HCL 4 MG/2ML IJ SOLN
INTRAMUSCULAR | Status: DC | PRN
  Administered 2020-08-02: 4 mg via INTRAVENOUS

## 2020-08-02 MED ORDER — BUPIVACAINE LIPOSOME 1.3 % IJ SUSP
INTRAMUSCULAR | Status: AC
  Filled 2020-08-02: qty 20

## 2020-08-02 MED ORDER — IBUPROFEN 600 MG PO TABS
600.0000 mg | ORAL_TABLET | Freq: Four times a day (QID) | ORAL | Status: DC
  Administered 2020-08-03 – 2020-08-04 (×5): 600 mg via ORAL
  Filled 2020-08-02 (×5): qty 1

## 2020-08-02 MED ORDER — NALOXONE HCL 0.4 MG/ML IJ SOLN: 0.4000 mg | INTRAMUSCULAR | Status: DC | PRN

## 2020-08-02 MED ORDER — CEFAZOLIN SODIUM-DEXTROSE 1-4 GM/50ML-% IV SOLN
INTRAVENOUS | Status: DC | PRN
  Administered 2020-08-02: 2 g via INTRAVENOUS

## 2020-08-02 MED ORDER — BUPIVACAINE HCL (PF) 0.5 % IJ SOLN
INTRAMUSCULAR | Status: AC
  Filled 2020-08-02: qty 30

## 2020-08-02 SURGICAL SUPPLY — 26 items
CHLORAPREP W/TINT 26 (MISCELLANEOUS) ×3 IMPLANT
COVER WAND RF STERILE (DRAPES) ×3 IMPLANT
DRSG TELFA 3X8 NADH (GAUZE/BANDAGES/DRESSINGS) ×3 IMPLANT
DRSG TELFA 4X4 ISLAND ADH (GAUZE/BANDAGES/DRESSINGS) ×3 IMPLANT
ELECT REM PT RETURN 9FT ADLT (ELECTROSURGICAL) ×3
ELECTRODE REM PT RTRN 9FT ADLT (ELECTROSURGICAL) ×1 IMPLANT
GAUZE SPONGE 4X4 12PLY STRL (GAUZE/BANDAGES/DRESSINGS) ×3 IMPLANT
GOWN STRL REUS W/ TWL LRG LVL3 (GOWN DISPOSABLE) ×3 IMPLANT
GOWN STRL REUS W/TWL LRG LVL3 (GOWN DISPOSABLE) ×6
MANIFOLD NEPTUNE II (INSTRUMENTS) ×3 IMPLANT
MAT PREVALON FULL STRYKER (MISCELLANEOUS) ×3 IMPLANT
NEEDLE HYPO 25GX1X1/2 BEV (NEEDLE) ×3 IMPLANT
NS IRRIG 1000ML POUR BTL (IV SOLUTION) ×3 IMPLANT
PACK C SECTION AR (MISCELLANEOUS) ×3 IMPLANT
PAD OB MATERNITY 4.3X12.25 (PERSONAL CARE ITEMS) ×3 IMPLANT
PAD PREP 24X41 OB/GYN DISP (PERSONAL CARE ITEMS) ×3 IMPLANT
PENCIL SMOKE EVACUATOR (MISCELLANEOUS) ×3 IMPLANT
SUT MNCRL 4-0 (SUTURE) ×2
SUT MNCRL 4-0 27XMFL (SUTURE) ×1
SUT VIC AB 0 CT1 36 (SUTURE) ×6 IMPLANT
SUT VIC AB 0 CTX 36 (SUTURE) ×4
SUT VIC AB 0 CTX36XBRD ANBCTRL (SUTURE) ×2 IMPLANT
SUT VIC AB 2-0 SH 27 (SUTURE) ×4
SUT VIC AB 2-0 SH 27XBRD (SUTURE) ×2 IMPLANT
SUTURE MNCRL 4-0 27XMF (SUTURE) ×1 IMPLANT
SYR 30ML LL (SYRINGE) ×6 IMPLANT
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
## 2020-08-02 NOTE — Anesthesia Procedure Notes (Signed)
Spinal  Patient location during procedure: OR Start time: 08/02/2020 12:54 AM End time: 08/02/2020 12:56 AM Reason for block: surgical anesthesia Staffing Performed: resident/CRNA  Anesthesiologist: Yevette Edwards, MD Resident/CRNA: Irving Burton, CRNA Preanesthetic Checklist Completed: patient identified, IV checked, site marked, risks and benefits discussed, surgical consent, monitors and equipment checked, pre-op evaluation and timeout performed Spinal Block Patient position: sitting Prep: DuraPrep Patient monitoring: heart rate, cardiac monitor, continuous pulse ox and blood pressure Approach: midline Location: L3-4 Injection technique: single-shot Needle Needle type: Whitacre  Needle gauge: 24 G Needle length: 9 cm Assessment Sensory level: T4 Events: CSF return

## 2020-08-02 NOTE — Anesthesia Postprocedure Evaluation (Signed)
Anesthesia Post Note  Patient: Belinda Weber  Procedure(s) Performed: CESAREAN SECTION  Patient location during evaluation: Mother Baby Anesthesia Type: Epidural Level of consciousness: awake, awake and alert and oriented Pain management: pain level controlled Vital Signs Assessment: post-procedure vital signs reviewed and stable Respiratory status: respiratory function stable, spontaneous breathing and nonlabored ventilation Cardiovascular status: blood pressure returned to baseline and stable Postop Assessment: no headache and no backache Anesthetic complications: no   No notable events documented.   Last Vitals:  Vitals:   08/02/20 0546 08/02/20 0741  BP: 119/78 116/69  Pulse: 98 94  Resp:  18  Temp: 37.1 C 37.2 C  SpO2: 97% 98%    Last Pain:  Vitals:   08/02/20 0741  TempSrc: Oral  PainSc:                  Ginger Carne

## 2020-08-02 NOTE — Progress Notes (Signed)
NRFT Cat II now with persistent lates, unchanged cervix.`  The risks of cesarean section discussed with the patient included but were not limited to: bleeding which may require transfusion or reoperation; infection which may require antibiotics; injury to bowel, bladder, ureters or other surrounding organs; injury to the fetus; need for additional procedures including hysterectomy in the event of a life-threatening hemorrhage; placental abnormalities wth subsequent pregnancies, incisional problems, thromboembolic phenomenon and other postoperative/anesthesia complications. The patient concurred with the proposed plan, giving informed written consent for the procedure.  Anesthesia and OR aware. Preoperative prophylactic antibiotics and SCDs ordered on call to the OR.  To OR when ready.

## 2020-08-02 NOTE — Progress Notes (Signed)
Anticoagulation monitoring(Lovenox):   25 yo female ordered Lovenox 40 mg Q24h    Filed Weights   08/01/20 0026  Weight: 98.2 kg (216 lb 6.4 oz)   BMI 35    Lab Results  Component Value Date   CREATININE 0.56 08/01/2020   Estimated Creatinine Clearance: 129.3 mL/min (by C-G formula based on SCr of 0.56 mg/dL). Hemoglobin & Hematocrit     Component Value Date/Time   HGB 12.3 08/01/2020 0024   HCT 36.3 08/01/2020 0024     Per Protocol for Patient with estCrcl > 30 ml/min and BMI > 30, will transition to Lovenox 50 mg Q24h.

## 2020-08-02 NOTE — Transfer of Care (Signed)
Immediate Anesthesia Transfer of Care Note  Patient: Belinda Weber  Procedure(s) Performed: CESAREAN SECTION  Patient Location: LDR 1  Anesthesia Type:Spinal  Level of Consciousness: awake, alert  and oriented  Airway & Oxygen Therapy: Patient Spontanous Breathing  Post-op Assessment: Report given to RN and Post -op Vital signs reviewed and stable  Post vital signs: Reviewed and stable  Last Vitals:  Vitals Value Taken Time  BP    Temp    Pulse    Resp    SpO2      Last Pain:  Vitals:   08/01/20 2326  TempSrc: Axillary  PainSc:          Complications: No notable events documented.

## 2020-08-02 NOTE — Lactation Note (Signed)
This note was copied from a baby's chart. Lactation Consultation Note  Patient Name: Belinda Weber [id number] Date: 08/02/2020 Reason for consult: Initial assessment;Primapara;NICU baby;Term Age:23 hours  Maternal Data    Feeding Mother's Current Feeding Choice: Breast Milk  LATCH Score                    Lactation Tools Discussed/Used Tools: Pump Breast pump type: Double-Electric Breast Pump Pump Education: Setup, frequency, and cleaning;Milk Storage Reason for Pumping: baby in scn Pumping frequency: q3hrs encouraged Pumped volume:  (drops)  Interventions Interventions: DEBP  Discharge Pump: Personal  Consult Status Consult Status: PRN    Dyann Kief 08/02/2020, 12:55 PM

## 2020-08-02 NOTE — Op Note (Signed)
Cesarean Section Procedure Note  Date of procedure: 08/02/2020   Pre-operative Diagnosis: Intrauterine pregnancy at [redacted]w[redacted]d;  - Persistent Cat II strip - failure to progress (no cervical change despite adequate contractions at 8 cm) - OP baby  Body mass index is 34.93 kg/m.  Post-operative Diagnosis: same, delivered. **Modified 22 for difficult case due to habitus  Procedure: Primary Low Transverse Cesarean Section through Pfannenstiel incision  Surgeon: Christeen Douglas, MD  Assistant(s):  Mitzi Davenport, [place]  Anesthesia: Epidural anesthesia and Spinal anesthesia  Anesthesiologist: Yevette Edwards, MD Anesthesiologist: Yevette Edwards, MD CRNA: Irving Burton, CRNA  Estimated Blood Loss:   500         Drains: Foley Cath  Dressings: Provena wound vac placed         Total IV Fluids:  Urine Output:         Specimens: cord blood and cord gas         Complications:  None; patient tolerated the procedure well.         Disposition: PACU - hemodynamically stable.         Condition: stable  Findings:  A female infant in cephalic OP presentation. Amniotic fluid - Clear  Birth weight 4170 g.  Apgars of 8 and 9 at one and five minutes respectively.  Intact placenta with a three-vessel cord.  Grossly normal uterus, tubes and ovaries bilaterally. No intraabdominal adhesions were noted.  Case was difficult due to pannus positioning. Traxi used.  Indications: failure to progress: arrest of dilation, malpresentation: OP, and non-reassuring fetal status  Procedure Details  The patient was taken to Operating Room, identified as the correct patient and the procedure verified as C-Section Delivery. A formal Time Out was held with all team members present and in agreement.  After induction of anesthesia, the patient was draped and prepped in the usual sterile manner. A Traxi pannus retractor was placed. A Pfannenstiel skin incision was made and carried down through the  subcutaneous tissue to the fascia. Fascial incision was made and extended transversely with the Mayo scissors. The fascia was separated from the underlying rectus tissue superiorly and inferiorly. The peritoneum was identified and entered bluntly. Peritoneal incision was extended longitudinally. The utero-vesical peritoneal reflection was incised transversely and a bladder flap was created digitally.   A low transverse hysterotomy was made. The fetus was delivered atraumatically. The umbilical cord was clamped x2 and cut and the infant was handed to the awaiting pediatricians. The placenta was removed intact and appeared normal, intact, and with a 3-vessel cord.   The uterus was exteriorized and cleared of all clot and debris. The hysterotomy was closed with running sutures of 0-Vicryl. A second imbricating layer was placed with the same suture. Excellent hemostasis was observed. The peritoneal cavity was cleared of all clots and debris. The uterus was returned to the abdomen.   The pelvis was irrigated and again, excellent hemostasis was noted. The fascia was then reapproximated with running sutures of 0 Vicryl. Gloves were changed. The subcutaneous tissue was reapproximated with running sutures of 0 Vicry. The skin was reapproximated with Ensorb absorbable sutures.  67ml (in 30 of 0.5% bupivicaine and 28ml of NSS) of liposomal bupivicaine placed in the fascial and skin lines.  Instrument, sponge, and needle counts were correct prior to the abdominal closure and at the conclusion of the case.   The patient tolerated the procedure well and was transferred to the recovery room in stable condition.   Christeen Douglas, MD  08/02/2020  

## 2020-08-02 NOTE — Anesthesia Post-op Follow-up Note (Signed)
  Anesthesia Pain Follow-up Note  Patient: Belinda Weber  Day #: 1  Date of Follow-up: 08/02/2020 Time: 9:14 AM  Last Vitals:  Vitals:   08/02/20 0546 08/02/20 0741  BP: 119/78 116/69  Pulse: 98 94  Resp:  18  Temp: 37.1 C 37.2 C  SpO2: 97% 98%    Level of Consciousness: alert  Pain: none   Side Effects:None  Catheter Site Exam:clean, dry  Anti-Coag Meds (From admission, onward)   Start     Dose/Rate Route Frequency Ordered Stop   08/03/20 2200  enoxaparin (LOVENOX) injection 50 mg        0.5 mg/kg  98.2 kg Subcutaneous  Once 08/02/20 1410         Plan: D/C from anesthesia care at surgeon's request  [facility]

## 2020-08-02 NOTE — Lactation Note (Signed)
This note was copied from a baby's chart. Lactation Consultation Note  Patient Name: Belinda Weber [id number] Date: 08/02/2020 Reason for consult: Follow-up assessment;Primapara;Term Age:23 hours  Maternal Data Has patient been taught Hand Expression?: Yes Does the patient have breastfeeding experience prior to this delivery?: No  Feeding Mother's Current Feeding Choice: Breast Milk Baby to room from SCN, mom assisted with positioning baby in right cradle hold, latched easily to breast and is nursing well with some stimulation and few swallows heard, mom shown how to shape breast tissue and obtain deep latch LATCH Score Latch: Grasps breast easily, tongue down, lips flanged, rhythmical sucking.  Audible Swallowing: A few with stimulation  Type of Nipple: Everted at rest and after stimulation  Comfort (Breast/Nipple): Soft / non-tender  Hold (Positioning): Assistance needed to correctly position infant at breast and maintain latch.  LATCH Score: 8   Lactation Tools Discussed/Used  LC name and no written on white board  Interventions Interventions: Breast feeding basics reviewed;Assisted with latch;Skin to skin;Hand express;Breast compression;Adjust position;Support pillows;Education  Discharge Pump: Personal WIC Program: Yes  Consult Status Consult Status: Follow-up Date: 08/03/20 Follow-up type: In-patient    Dyann Kief 08/02/2020, 6:35 PM

## 2020-08-03 ENCOUNTER — Encounter: Payer: Self-pay | Admitting: Obstetrics and Gynecology

## 2020-08-03 MED ORDER — ACETAMINOPHEN 500 MG PO TABS
1000.0000 mg | ORAL_TABLET | Freq: Four times a day (QID) | ORAL | Status: DC
  Administered 2020-08-04 (×2): 1000 mg via ORAL
  Filled 2020-08-03 (×2): qty 2

## 2020-08-03 NOTE — Progress Notes (Signed)
Post Partum Day/ POD 1 Subjective: Doing well, no complaints.  Tolerating regular diet, pain with PO meds, voiding and ambulating without difficulty.  No CP SOB Fever,Chills, N/V or leg pain; denies nipple or breast pain, no HA change of vision, RUQ/epigastric pain  Objective: BP 107/61 (BP Location: Left Arm)   Pulse (!) 101   Temp 98.2 F (36.8 C) (Oral)   Resp 18   Ht 5\' 6"  (1.676 m)   Wt 98.2 kg   LMP 11/01/2019 (Exact Date)   SpO2 97%   Breastfeeding Unknown   BMI 34.93 kg/m    Physical Exam:  General: NAD Breasts: soft/nontender CV: RRR Pulm: nl effort, CTABL Abdomen: soft, NT, BS x 4 Incision: Pressure Dsg CDI, no erythema or drainage Lochia: small Uterine Fundus: fundus firm and 2 fb below umbilicus DVT Evaluation: no cords, ttp LEs   Recent Labs    08/01/20 0024 08/02/20 0728  HGB 12.3 11.6*  HCT 36.3 34.6*  WBC 10.4 22.3*  PLT 229 224    Assessment/Plan: 25 y.o. G1P1001 postpartum day # 1  - Continue routine PP care, encouraged shower, and RN to place honeycomb dsg today.  - Lactation consult prn.  - Acute blood loss anemia - hemodynamically stable and asymptomatic; start po ferrous sulfate BID with stool softeners  - elevated WBCs on CBC, c/w labor/induction --> CS, plan repeat CBC in AM, afebrile.  - Immunization status: Needs varicella prior to DC   Disposition: Does not desire Dc home today.     30, CNM 08/03/2020  11:26 AM

## 2020-08-03 NOTE — Lactation Note (Addendum)
This note was copied from a baby's chart. Lactation Consultation Note  Patient Name: Belinda Weber [id number] Date: 08/03/2020   Age:23 hours  Maternal Data    Feeding   [name] has been observed breast feeding well with strong, rhythmic sucking and occasional audible swallows.  Mom can easily hand express colostrum.  [name] did get 2 bottles of formula yesterday when in SCN for low blood glucoses and dusky episode.  [name] was set up in mom's room at the time with instructions in pumping, storage, collection, cleaning and handling of breast milk. He was circumcised this am, but has not interfered with his breast feeding.  Hand out has been given on what to expect with breast feeding the first  4 days of life and reviewed normal newborn stomach size, feeding cues, adequate intake and out put, supply and demand, normal course of lactation and routine newborn feeding patterns.  Lactation [name] have been given and reviewed support groups, [employer] support, informational web sites and contact numbers.  Lactation name and number has been written on white board and encouraged to call with any questions, concerns or assistance. LATCH Score                    Lactation Tools Discussed/Used    Interventions    Discharge    Consult Status      Belinda Weber 08/03/2020, 8:53 PM

## 2020-08-04 LAB — CBC WITH DIFFERENTIAL/PLATELET
Abs Immature Granulocytes: 0.05 10*3/uL (ref 0.00–0.07)
Basophils Absolute: 0 10*3/uL (ref 0.0–0.1)
Basophils Relative: 0 %
Eosinophils Absolute: 0.2 10*3/uL (ref 0.0–0.5)
Eosinophils Relative: 2 %
HCT: 29 % — ABNORMAL LOW (ref 36.0–46.0)
Hemoglobin: 9.5 g/dL — ABNORMAL LOW (ref 12.0–15.0)
Immature Granulocytes: 1 %
Lymphocytes Relative: 18 %
Lymphs Abs: 1.7 10*3/uL (ref 0.7–4.0)
MCH: 28.2 pg (ref 26.0–34.0)
MCHC: 32.8 g/dL (ref 30.0–36.0)
MCV: 86.1 fL (ref 80.0–100.0)
Monocytes Absolute: 0.8 10*3/uL (ref 0.1–1.0)
Monocytes Relative: 9 %
Neutro Abs: 6.7 10*3/uL (ref 1.7–7.7)
Neutrophils Relative %: 70 %
Platelets: 213 10*3/uL (ref 150–400)
RBC: 3.37 MIL/uL — ABNORMAL LOW (ref 3.87–5.11)
RDW: 15.2 % (ref 11.5–15.5)
WBC: 9.4 10*3/uL (ref 4.0–10.5)
nRBC: 0 % (ref 0.0–0.2)

## 2020-08-04 MED ORDER — VARICELLA VIRUS VACCINE LIVE 1350 PFU/0.5ML IJ SUSR
0.5000 mL | Freq: Once | INTRAMUSCULAR | Status: DC
  Filled 2020-08-04: qty 0.5

## 2020-08-04 MED ORDER — SENNOSIDES-DOCUSATE SODIUM 8.6-50 MG PO TABS: 2.0000 | ORAL_TABLET | ORAL | 0 refills | Status: DC

## 2020-08-04 MED ORDER — COCONUT OIL OIL: 1.0000 "application " | TOPICAL_OIL | 0 refills | Status: DC | PRN

## 2020-08-04 MED ORDER — OXYCODONE HCL 5 MG PO TABS: 5.0000 mg | ORAL_TABLET | Freq: Four times a day (QID) | ORAL | 0 refills | Status: AC | PRN

## 2020-08-04 MED ORDER — DIPHENHYDRAMINE HCL 25 MG PO CAPS: 25.0000 mg | ORAL_CAPSULE | Freq: Four times a day (QID) | ORAL | 0 refills | Status: DC | PRN

## 2020-08-04 MED ORDER — WITCH HAZEL-GLYCERIN EX PADS: 1.0000 "application " | MEDICATED_PAD | CUTANEOUS | 12 refills | Status: DC | PRN

## 2020-08-04 MED ORDER — SIMETHICONE 80 MG PO CHEW: 80.0000 mg | CHEWABLE_TABLET | Freq: Three times a day (TID) | ORAL | 0 refills | Status: DC

## 2020-08-04 MED ORDER — FERROUS SULFATE 325 (65 FE) MG PO TABS: 325.0000 mg | ORAL_TABLET | Freq: Two times a day (BID) | ORAL | 0 refills | Status: DC

## 2020-08-04 MED ORDER — ACETAMINOPHEN 500 MG PO TABS: 1000.0000 mg | ORAL_TABLET | Freq: Four times a day (QID) | ORAL | 0 refills | Status: DC

## 2020-08-04 MED ORDER — IBUPROFEN 600 MG PO TABS: 600.0000 mg | ORAL_TABLET | Freq: Four times a day (QID) | ORAL | 0 refills | Status: DC

## 2020-08-04 NOTE — Addendum Note (Signed)
Addendum  created 08/04/20 0733 by [name], MD   Clinical Note Signed

## 2020-08-04 NOTE — Progress Notes (Signed)
Patient discharged with infant. Discharge instructions, prescriptions, and follow up appointments given to and reviewed with patient. Patient verbalized understanding. Will be escorted out by axillary.  °

## 2020-08-04 NOTE — Anesthesia Postprocedure Evaluation (Signed)
Anesthesia Post Note  Patient: Belinda Weber  Procedure(s) Performed: CESAREAN SECTION  Patient location during evaluation: Mother Baby Anesthesia Type: Epidural Level of consciousness: oriented and awake and alert Pain management: pain level controlled Vital Signs Assessment: post-procedure vital signs reviewed and stable Respiratory status: spontaneous breathing and respiratory function stable Cardiovascular status: blood pressure returned to baseline and stable Postop Assessment: no headache, no backache, no apparent nausea or vomiting and able to ambulate Anesthetic complications: no   No notable events documented.   Last Vitals:  Vitals:   08/03/20 1533 08/03/20 2319  BP: 112/82 109/66  Pulse: (!) 109 96  Resp: 18 20  Temp: 37.2 C 36.7 C  SpO2: 97% 98%    Last Pain:  Vitals:   08/04/20 0604  TempSrc:   PainSc: 3                  [name]

## 2020-10-26 ENCOUNTER — Emergency Department
Admission: EM | Admit: 2020-10-26 | Discharge: 2020-10-26 | Disposition: A | Discharge status: Home / Self Care | Payer: Medicaid Other | Attending: Emergency Medicine | Admitting: Emergency Medicine

## 2020-10-26 ENCOUNTER — Other Ambulatory Visit: Payer: Self-pay

## 2020-10-26 DIAGNOSIS — Z4800 Encounter for change or removal of nonsurgical wound dressing: Secondary | ICD-10-CM | POA: Diagnosis not present

## 2020-10-26 DIAGNOSIS — Z5321 Procedure and treatment not carried out due to patient leaving prior to being seen by health care provider: Secondary | ICD-10-CM | POA: Insufficient documentation

## 2020-10-26 DIAGNOSIS — R1010 Upper abdominal pain, unspecified: Secondary | ICD-10-CM | POA: Diagnosis present

## 2020-10-26 NOTE — ED Notes (Signed)
Called pt no answer °

## 2020-10-26 NOTE — ED Notes (Signed)
Pt was called to registration to verify information and pt stated they had left and were no longer waiting to be seen.

## 2020-10-26 NOTE — ED Triage Notes (Signed)
Pt states her OBGYN sent her for a CT scan- pt states that she has a lump in her upper abdomen that her OB states is probably a hernia and wants it checked out- pt states her OB told her it was related to her C-section

## 2020-10-27 ENCOUNTER — Emergency Department
Admission: EM | Admit: 2020-10-27 | Discharge: 2020-10-27 | Disposition: A | Discharge status: Home / Self Care | Payer: Medicaid Other | Attending: Emergency Medicine | Admitting: Emergency Medicine

## 2020-10-27 ENCOUNTER — Encounter: Payer: Self-pay | Admitting: Physician Assistant

## 2020-10-27 ENCOUNTER — Other Ambulatory Visit: Payer: Self-pay

## 2020-10-27 DIAGNOSIS — K436 Other and unspecified ventral hernia with obstruction, without gangrene: Secondary | ICD-10-CM | POA: Diagnosis present

## 2020-10-27 DIAGNOSIS — Z0279 Encounter for issue of other medical certificate: Secondary | ICD-10-CM | POA: Diagnosis not present

## 2020-10-27 NOTE — Discharge Instructions (Addendum)
You should await scheduling of your outpatient CT scan. Follow-up with your OB provider or return to the ED for any concerning symptoms, as discussed.

## 2020-10-27 NOTE — ED Triage Notes (Signed)
Pt here for a CT scan from her OB's office. Pt had to leave early yesterday to get her child and is here now to have scan done.

## 2020-10-27 NOTE — ED Notes (Signed)
See triage note  States she was here for possible out pt ct scan   On arrival   No order noted  Provider aware
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
## 2020-11-05 ENCOUNTER — Other Ambulatory Visit: Payer: Self-pay | Admitting: Obstetrics and Gynecology

## 2020-11-05 DIAGNOSIS — T8189XA Other complications of procedures, not elsewhere classified, initial encounter: Secondary | ICD-10-CM

## 2020-11-05 DIAGNOSIS — R1010 Upper abdominal pain, unspecified: Secondary | ICD-10-CM

## 2020-11-05 DIAGNOSIS — Z98891 History of uterine scar from previous surgery: Secondary | ICD-10-CM

## 2020-11-15 ENCOUNTER — Ambulatory Visit
Admission: RE | Admit: 2020-11-15 | Discharge: 2020-11-15 | Disposition: A | Discharge status: Home / Self Care | Payer: Medicaid Other | Source: Ambulatory Visit | Attending: Obstetrics and Gynecology | Admitting: Obstetrics and Gynecology

## 2020-11-15 ENCOUNTER — Other Ambulatory Visit: Payer: Self-pay

## 2020-11-15 DIAGNOSIS — Z98891 History of uterine scar from previous surgery: Secondary | ICD-10-CM | POA: Diagnosis present

## 2020-11-15 DIAGNOSIS — R1010 Upper abdominal pain, unspecified: Secondary | ICD-10-CM | POA: Insufficient documentation

## 2020-11-15 DIAGNOSIS — T8189XA Other complications of procedures, not elsewhere classified, initial encounter: Secondary | ICD-10-CM | POA: Diagnosis not present

## 2020-11-15 MED ORDER — IOHEXOL 350 MG/ML SOLN
100.0000 mL | Freq: Once | INTRAVENOUS | Status: AC | PRN
  Administered 2020-11-15: 100 mL via INTRAVENOUS

## 2020-12-26 ENCOUNTER — Ambulatory Visit
Admission: RE | Admit: 2020-12-26 | Discharge: 2020-12-26 | Disposition: A | Discharge status: Home / Self Care | Payer: Medicaid Other | Source: Ambulatory Visit | Attending: Physician Assistant | Admitting: Physician Assistant

## 2020-12-26 ENCOUNTER — Other Ambulatory Visit: Payer: Self-pay

## 2020-12-26 VITALS — BP 123/74 | HR 88 | Temp 98.5°F | Resp 18

## 2020-12-26 DIAGNOSIS — H6591 Unspecified nonsuppurative otitis media, right ear: Secondary | ICD-10-CM

## 2020-12-26 MED ORDER — CEFDINIR 300 MG PO CAPS: 300.0000 mg | ORAL_CAPSULE | Freq: Two times a day (BID) | ORAL | 0 refills | Status: AC

## 2020-12-26 NOTE — ED Triage Notes (Signed)
Pt presents today with c/o of bilateral ear pain on/off for two months. Denies fever.

## 2020-12-26 NOTE — Discharge Instructions (Signed)
-  Start Sudafed and Flonase in addition to antibiotics  -As we discussed this can take a couple of weeks to resolve
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
## 2021-03-31 ENCOUNTER — Ambulatory Visit
Admission: RE | Admit: 2021-03-31 | Discharge: 2021-03-31 | Disposition: A | Discharge status: Home / Self Care | Payer: Medicaid Other | Source: Ambulatory Visit | Attending: Emergency Medicine | Admitting: Emergency Medicine

## 2021-03-31 ENCOUNTER — Other Ambulatory Visit: Payer: Self-pay

## 2021-03-31 VITALS — BP 135/85 | HR 111 | Temp 99.7°F | Resp 18 | Ht 67.0 in | Wt 202.0 lb

## 2021-03-31 DIAGNOSIS — H66003 Acute suppurative otitis media without spontaneous rupture of ear drum, bilateral: Secondary | ICD-10-CM | POA: Diagnosis not present

## 2021-03-31 DIAGNOSIS — J069 Acute upper respiratory infection, unspecified: Secondary | ICD-10-CM

## 2021-03-31 MED ORDER — IPRATROPIUM BROMIDE 0.06 % NA SOLN: 2.0000 | Freq: Four times a day (QID) | NASAL | 12 refills | Status: DC

## 2021-03-31 MED ORDER — AMOXICILLIN-POT CLAVULANATE 875-125 MG PO TABS: 1.0000 | ORAL_TABLET | Freq: Two times a day (BID) | ORAL | 0 refills | Status: AC

## 2021-03-31 NOTE — ED Triage Notes (Signed)
Pt here with C/O right ear pain, has had a "cold" for 2 weeks, blew nose last night an heard a pop in right ear has been in pain since.

## 2021-03-31 NOTE — Discharge Instructions (Signed)
Take the Augmentin twice daily for 10 days with food for treatment of your ear infection.  Use the Atrovent nasal spray, 2 squirts in each nostril every 6 hours, as needed for nasal congestion.  Take an over-the-counter probiotic 1 hour after each dose of antibiotic to prevent diarrhea.  Use over-the-counter Tylenol and ibuprofen as needed for pain or fever.  Place a hot water bottle, or heating pad, underneath your pillowcase at night to help dilate up your ear and aid in pain relief as well as resolution of the infection.  Return for reevaluation for any new or worsening symptoms.
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
## 2021-09-01 ENCOUNTER — Ambulatory Visit: Payer: Medicaid Other

## 2021-11-16 ENCOUNTER — Ambulatory Visit (INDEPENDENT_AMBULATORY_CARE_PROVIDER_SITE_OTHER): Payer: Medicaid Other

## 2021-11-16 ENCOUNTER — Ambulatory Visit
Admission: RE | Admit: 2021-11-16 | Discharge: 2021-11-16 | Disposition: A | Discharge status: Home / Self Care | Payer: Medicaid Other | Source: Ambulatory Visit | Attending: Emergency Medicine | Admitting: Emergency Medicine

## 2021-11-16 VITALS — BP 123/74 | HR 92 | Temp 98.1°F | Resp 14 | Ht 67.0 in | Wt 205.0 lb

## 2021-11-16 DIAGNOSIS — M25562 Pain in left knee: Secondary | ICD-10-CM | POA: Diagnosis not present

## 2021-11-16 MED ORDER — IBUPROFEN 600 MG PO TABS: 600.0000 mg | ORAL_TABLET | Freq: Four times a day (QID) | ORAL | 0 refills | Status: DC | PRN
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
## 2021-11-16 NOTE — ED Triage Notes (Signed)
Patient c/o left knee pain that started 2 weeks ago.  Patient denies injury or fall.

## 2021-11-16 NOTE — Discharge Instructions (Addendum)
Your x-rays did not show any signs of broken bones, dislocated bones, or degeneration (arthritis).  I believe the source of your pain is coming from inflamed ligaments.  Wear the knee brace when you are up and moving to help provide support and aid in comfort.  You can take it off when you bathe or when you are not walking.  Take the ibuprofen every 6 hours with food as needed for pain and inflammation.  You may apply ice, or moist heat, to your knee for 20 minutes at a time 2-3 times a day to help with pain and inflammation.  Follow the home physical therapy exercise guidelines given your discharge instructions.  If your symptoms continue I recommend that you follow-up with orthopedics at [facility] clinic.  You may need some designated physical therapy to help with the pain.

## 2021-12-31 ENCOUNTER — Ambulatory Visit: Payer: Medicaid Other

## 2022-01-23 DIAGNOSIS — O0992 Supervision of high risk pregnancy, unspecified, second trimester: Secondary | ICD-10-CM | POA: Insufficient documentation

## 2022-02-26 LAB — OB RESULTS CONSOLE RUBELLA ANTIBODY, IGM: Rubella: IMMUNE

## 2022-02-26 LAB — OB RESULTS CONSOLE GC/CHLAMYDIA
Chlamydia: NEGATIVE
Neisseria Gonorrhea: NEGATIVE

## 2022-02-26 LAB — OB RESULTS CONSOLE HIV ANTIBODY (ROUTINE TESTING): HIV: NONREACTIVE

## 2022-02-26 LAB — OB RESULTS CONSOLE VARICELLA ZOSTER ANTIBODY, IGG: Varicella: NON-IMMUNE/NOT IMMUNE

## 2022-02-26 LAB — OB RESULTS CONSOLE HEPATITIS B SURFACE ANTIGEN: Hepatitis B Surface Ag: NEGATIVE

## 2022-02-26 LAB — HEPATITIS C ANTIBODY: HCV Ab: NEGATIVE

## 2022-04-16 DIAGNOSIS — Z3490 Encounter for supervision of normal pregnancy, unspecified, unspecified trimester: Secondary | ICD-10-CM | POA: Insufficient documentation

## 2022-04-17 ENCOUNTER — Encounter: Payer: Self-pay | Admitting: Obstetrics and Gynecology

## 2022-04-17 ENCOUNTER — Observation Stay
Admission: EM | Admit: 2022-04-17 | Discharge: 2022-04-17 | Disposition: A | Discharge status: Home / Self Care | Payer: Medicaid Other | Attending: Obstetrics and Gynecology | Admitting: Obstetrics and Gynecology

## 2022-04-17 ENCOUNTER — Other Ambulatory Visit: Payer: Self-pay

## 2022-04-17 DIAGNOSIS — Z98891 History of uterine scar from previous surgery: Secondary | ICD-10-CM | POA: Insufficient documentation

## 2022-04-17 DIAGNOSIS — Z0371 Encounter for suspected problem with amniotic cavity and membrane ruled out: Secondary | ICD-10-CM | POA: Diagnosis present

## 2022-04-17 DIAGNOSIS — Z3A20 20 weeks gestation of pregnancy: Secondary | ICD-10-CM | POA: Diagnosis not present

## 2022-04-17 DIAGNOSIS — O4192X0 Disorder of amniotic fluid and membranes, unspecified, second trimester, not applicable or unspecified: Secondary | ICD-10-CM | POA: Diagnosis present

## 2022-04-17 LAB — WET PREP, GENITAL
Clue Cells Wet Prep HPF POC: NONE SEEN
Sperm: NONE SEEN
Trich, Wet Prep: NONE SEEN
WBC, Wet Prep HPF POC: <10 (ref <10)
Yeast Wet Prep HPF POC: NONE SEEN

## 2022-04-17 LAB — URINALYSIS, COMPLETE (UACMP) WITH MICROSCOPIC
Bacteria, UA: NONE SEEN
Bilirubin Urine: NEGATIVE
Glucose, UA: NEGATIVE mg/dL
Hgb urine dipstick: NEGATIVE
Ketones, ur: 20 mg/dL — AB
Leukocytes,Ua: NEGATIVE
Nitrite: NEGATIVE
Protein, ur: NEGATIVE mg/dL
Specific Gravity, Urine: 1.01 (ref 1.005–1.030)
pH: 6 (ref 5.0–8.0)

## 2022-04-17 LAB — CHLAMYDIA/NGC RT PCR (ARMC ONLY)
Chlamydia Tr: NOT DETECTED
N gonorrhoeae: NOT DETECTED

## 2022-04-17 NOTE — Progress Notes (Signed)
Belinda Weber 27 y.o. presents to Labor & Delivery triage via wheelchair steered by ED staff reporting LOF. She is a G2P1001 at [redacted]w[redacted]d. She denies signs and symptoms consistent with active vaginal bleeding. She denies contractions and states positive fetal movement. External FM and TOCO applied to non-tender abdomen. Initial FHR 165. Vital signs obtained and within normal limits. Patient oriented to care environment including call bell and bed control use. Mcvey, CNM notified of patient's arrival. Plan to obtain labs and get an [place]

## 2022-04-17 NOTE — Discharge Summary (Signed)
Belinda Weber is a 27 y.o. female. She is at 29w1dgestation. Patient's last menstrual period was 10/17/2021 (approximate). Estimated Date of Delivery: 09/03/22  [facility]  Current pregnancy complicated by:  Prior CS, macrosomia Hx polyhydramnios Pre-preg BMI 35 Iron-deficiency anemia  Chief complaint: has had 3 small gushes of fluid, has not had to put a pad on, but has soaked through 3 pairs of underwear. Has felt mildly crampy since the end of her workday today. Feeling fetal movement and denies VB.    S: Resting comfortably. no CTX, no VB.  Active fetal movement.  Denies: HA, visual changes, SOB, or RUQ/epigastric pain  Maternal Medical History:   Past Medical History:  Diagnosis Date   Anxiety    Bipolar 1 disorder [place]    Medical history non-contributory     Past Surgical History:  Procedure Laterality Date   CESAREAN SECTION  08/02/2020   Procedure: CESAREAN SECTION;  Surgeon: BBenjaman Kindler MD;  Location: [facility] ORS;  Service: Obstetrics;;   NO PAST SURGERIES      Allergies  Allergen Reactions   Paxil [Paroxetine]     Prior to Admission medications   Medication Sig Start Date End Date Taking? Authorizing Provider  ibuprofen (ADVIL) 600 MG tablet Take 1 tablet (600 mg total) by mouth every 6 (six) hours as needed. 11/16/21   RMargarette Canada NP      Social History: She  reports that she has never smoked. She has never used smokeless tobacco. She reports that she does not drink alcohol and does not use drugs.  Family History: no history of gyn cancers  Review of Systems: A full review of systems was performed and negative except as noted in the HPI.     O:  BP 109/64 (BP Location: Left Arm)   Pulse (!) 104   Temp 98.8 F (37.1 C) (Oral)   Resp 18   LMP 10/17/2021 (Approximate)  Results for orders placed or performed during the hospital encounter of 04/17/22 (from the past 48 hour(s))  Wet prep, genital   Collection Time:  04/17/22  8:29 PM   Specimen: Cervix  Result Value Ref Range   Yeast Wet Prep HPF POC NONE SEEN NONE SEEN   Trich, Wet Prep NONE SEEN NONE SEEN   Clue Cells Wet Prep HPF POC NONE SEEN NONE SEEN   WBC, Wet Prep HPF POC <10 <10   Sperm NONE SEEN      Constitutional: NAD, AAOx3  HE/ENT: extraocular movements grossly intact, moist mucous membranes CV: RRR PULM: nl respiratory effort, CTABL     Abd: gravid, non-tender, non-distended, soft      Ext: Non-tender, Nonedematous   Psych: mood appropriate, speech normal Pelvic: SVE 1/80/-2, soft/posterior  Fetal  monitoring: 165bpm, via doppler  TOCO: no UCs,   Bedside [place] active SIUP noted, breech presentation, MVP 5.4cm; subjectively normal fluid  A/P: 27y.o. G2P1001 at 255w1dere for antenatal surveillance for suspected ROM not found  Principle Diagnosis:  suspected ROM not found; 20wks   Fetal Wellbeing: FHR reassuring by doppler Wet prep negative, UA, GC/CT sent D/c home stable, precautions reviewed, follow-up as scheduled.    ReFrancetta FoundCNM 04/17/2022  9:17 PM

## 2022-04-17 NOTE — OB Triage Note (Signed)
Patient discharged home per order.  She is stable and ambulatory. An After Visit Summary was printed and given to the patient. Discharge education completed with patient and support person including follow up instructions, appointments, and medication list. She received labor and bleeding precautions. Patient able to verbalize understanding. All questions fully answered upon discharge. Patient instructed to return to ED, call 911, or call provider for any changes in condition. Patient discharged home via personal vehicle and all belongings.

## 2022-07-24 ENCOUNTER — Observation Stay
Admission: EM | Admit: 2022-07-24 | Discharge: 2022-07-24 | Disposition: A | Discharge status: Home / Self Care | Payer: Medicaid Other | Attending: Certified Nurse Midwife | Admitting: Certified Nurse Midwife

## 2022-07-24 ENCOUNTER — Encounter: Payer: Self-pay | Admitting: Obstetrics and Gynecology

## 2022-07-24 ENCOUNTER — Other Ambulatory Visit: Payer: Self-pay

## 2022-07-24 DIAGNOSIS — O99613 Diseases of the digestive system complicating pregnancy, third trimester: Secondary | ICD-10-CM | POA: Diagnosis not present

## 2022-07-24 DIAGNOSIS — O219 Vomiting of pregnancy, unspecified: Principal | ICD-10-CM | POA: Insufficient documentation

## 2022-07-24 DIAGNOSIS — R112 Nausea with vomiting, unspecified: Secondary | ICD-10-CM | POA: Diagnosis present

## 2022-07-24 DIAGNOSIS — Z3A34 34 weeks gestation of pregnancy: Secondary | ICD-10-CM | POA: Insufficient documentation

## 2022-07-24 MED ORDER — ZOLPIDEM TARTRATE 5 MG PO TABS: 5.0000 mg | ORAL_TABLET | Freq: Every evening | ORAL | Status: DC | PRN

## 2022-07-24 MED ORDER — LACTATED RINGERS IV SOLN: 125.0000 mL/h | INTRAVENOUS | Status: DC

## 2022-07-24 MED ORDER — LACTATED RINGERS IV SOLN: INTRAVENOUS | Status: DC

## 2022-07-24 MED ORDER — DOCUSATE SODIUM 100 MG PO CAPS: 100.0000 mg | ORAL_CAPSULE | Freq: Every day | ORAL | Status: DC

## 2022-07-24 MED ORDER — ONDANSETRON 4 MG PO TBDP: 4.0000 mg | ORAL_TABLET | Freq: Three times a day (TID) | ORAL | 0 refills | Status: DC | PRN

## 2022-07-24 MED ORDER — CALCIUM CARBONATE ANTACID 500 MG PO CHEW: 2.0000 | CHEWABLE_TABLET | ORAL | Status: DC | PRN

## 2022-07-24 MED ORDER — LACTATED RINGERS IV BOLUS
1000.0000 mL | Freq: Once | INTRAVENOUS | Status: AC
  Administered 2022-07-24: 1000 mL via INTRAVENOUS

## 2022-07-24 MED ORDER — ACETAMINOPHEN 325 MG PO TABS
650.0000 mg | ORAL_TABLET | ORAL | Status: DC | PRN
  Administered 2022-07-24: 650 mg via ORAL
  Filled 2022-07-24: qty 2

## 2022-07-24 MED ORDER — ONDANSETRON HCL 4 MG/2ML IJ SOLN
4.0000 mg | Freq: Once | INTRAMUSCULAR | Status: AC
  Administered 2022-07-24: 4 mg via INTRAVENOUS
  Filled 2022-07-24: qty 2

## 2022-07-24 MED ORDER — PRENATAL MULTIVITAMIN CH: 1.0000 | ORAL_TABLET | Freq: Every day | ORAL | Status: DC

## 2022-07-24 MED ORDER — ACETAMINOPHEN 325 MG PO TABS: 650.0000 mg | ORAL_TABLET | Freq: Four times a day (QID) | ORAL | 0 refills | Status: DC | PRN

## 2022-07-24 NOTE — OB Triage Note (Signed)
Pt arrives a G2 P1 with c/o N/V/D since Wednesday night. Pt states that she felt a little bit better yesterday and went to work but woke up feeling sick tonight. Pt denies any fluid leaking or blood.

## 2022-07-24 NOTE — Discharge Summary (Signed)
Belinda Weber is a 27 y.o. female. She is at [redacted]w[redacted]d. Patient's last menstrual period was 10/28/2021. Estimated Date of Delivery: None noted.  Prenatal care site: [facility]   Current pregnancy complicated by:  - previous cesarean section - Anemia - Hx of macrosomia and polyhydramnios - Obesity - Varicella non-immune  Chief complaint: nausea and vomiting   She reports nausea, vomiting, and diarrhea since Wednesday night.   S: Resting comfortably. no CTX, no VB.no LOF,  Active fetal movement. Denies: HA, visual changes, SOB, or RUQ/epigastric pain  Maternal Medical History:   Past Medical History:  Diagnosis Date   Anxiety    Bipolar 1 disorder (HCC)    Medical history non-contributory     Past Surgical History:  Procedure Laterality Date   CESAREAN SECTION  08/02/2020   Procedure: CESAREAN SECTION;  Surgeon: Christeen Douglas, MD;  Location: [facility] ORS;  Service: Obstetrics;;   NO PAST SURGERIES      Allergies  Allergen Reactions   Paxil [Paroxetine]     Prior to Admission medications   Medication Sig Start Date End Date Taking? Authorizing Provider  ibuprofen (ADVIL) 600 MG tablet Take 1 tablet (600 mg total) by mouth every 6 (six) hours as needed. 11/16/21   Becky Augusta, NP  Prenatal Vit-Fe Fumarate-FA (PRENATAL MULTIVITAMIN) TABS tablet Take 1 tablet by mouth daily at 12 noon.    [provider]      Social History: She  reports that she has never smoked. She has never used smokeless tobacco. She reports that she does not drink alcohol and does not use drugs.  Family History: family history is not on file.  no history of gyn cancers  Review of Systems: A full review of systems was performed and negative except as noted in the HPI.     O:  BP 122/67 (BP Location: Left Arm)   Pulse 87   Temp 98.2 F (36.8 C) (Oral)   Resp 18   Ht 5\' 6"  (1.676 m)   Wt 99.8 kg   LMP 10/28/2021   BMI 35.51 kg/m  No results found for this or any previous  visit (from the past 48 hour(s)).   Constitutional: NAD, AAOx3  HE/ENT: extraocular movements grossly intact, moist mucous membranes CV: RRR PULM: nl respiratory effort, CTABL     Abd: gravid, non-tender, non-distended, soft      Ext: Non-tender, Nonedematous   Psych: mood appropriate, speech normal Pelvic: deferred  Fetal  monitoring: Cat 1 Appropriate for GA Baseline:  140bpm Variability: moderate Accelerations: present x >2 Decelerations absent  A/P: 27 y.o. G2P1001 at [redacted]w[redacted]d here for antenatal surveillance for nausea, vomiting, and diarrhea  Principle Diagnosis:  Nausea, vomiting, and diarrhea  Nausea and vomiting: received IVF and nausea medication and stated she felt better. Rx for Zofran sent to her pharmacy.  Fetal Wellbeing: Reassuring Cat 1 tracing. Reactive NST  D/c home stable, precautions reviewed, follow-up as scheduled.    Janyce Llanos, CNM 07/24/2022 7:18 AM

## 2022-07-24 NOTE — Discharge Instructions (Addendum)
Please keep your next scheduled appointment.  If you have questions or concerns you should call the on call provider.  If you have urgent concerns go to the nearest emergency department for evaluation. Follow up care discussed.  Printed copies given to patient for discharge instruction reference.

## 2022-08-25 ENCOUNTER — Other Ambulatory Visit: Payer: Self-pay

## 2022-08-25 ENCOUNTER — Encounter
Admission: RE | Admit: 2022-08-25 | Discharge: 2022-08-25 | Disposition: A | Discharge status: Home / Self Care | Payer: Medicaid Other | Source: Ambulatory Visit | Attending: Obstetrics and Gynecology | Admitting: Obstetrics and Gynecology

## 2022-08-25 HISTORY — DX: COVID-19: U07.1

## 2022-08-25 NOTE — Patient Instructions (Signed)
Your procedure is scheduled on: Monday 08/31/22    Report to the Registration Desk on the 1st floor of the [facility] at 7:30 am. Hospital staff or Volunteer may escort you to [facility] [phone] call for any questions) Wheelchairs and Free Valet parking are available at the [facility] entrance.  If your arrival time is 6:00 am, do not arrive before that time as the [facility] entrance doors do not open until 6:00 am.  REMEMBER: Instructions that are not followed completely may result in serious medical risk, up to and including death; or upon the discretion of your surgeon and anesthesiologist your surgery may need to be rescheduled.  Do not eat food or drink any liquids after midnight the night before surgery.  No gum chewing or hard candies.  One week prior to surgery: Stop Anti-inflammatories (NSAIDS) such as Advil, Aleve, Ibuprofen, Motrin, Naproxen, Naprosyn and Aspirin based products such as Excedrin, Goody's Powder, BC Powder. You may however, continue to take Tylenol if needed for pain up until the day of surgery.  TAKE ONLY THESE MEDICATIONS THE MORNING OF SURGERY WITH A SIP OF WATER:  none  No Alcohol for 24 hours before or after surgery.  No Smoking including e-cigarettes for 24 hours before surgery.  No chewable tobacco products for at least 6 hours before surgery.  No nicotine patches on the day of surgery.  Do not use any "recreational" drugs for at least a week (preferably 2 weeks) before your surgery.  Please be advised that the combination of cocaine and anesthesia may have negative outcomes, up to and including death. If you test positive for cocaine, your surgery will be cancelled.  On the morning of surgery brush your teeth with toothpaste and water, you may rinse your mouth with mouthwash if you wish. Do not swallow any toothpaste or mouthwash.  Use CHG Soap or wipes as directed on instruction sheet. (If planning to breast feed avoid using soap on  breast tissue.)  Do not wear lotions, powders, or perfumes.   Do not shave body hair from the neck down 48 hours before surgery.  Wear comfortable clothing (specific to your surgery type) to the hospital.  Do not wear jewelry, make-up, hairpins, clips or nail polish.  Contact lenses, hearing aids and dentures may not be worn into surgery.  Do not bring valuables to the hospital. [facility] is not responsible for any missing/lost belongings or valuables.   Notify your doctor if there is any change in your medical condition (cold, fever, infection).  If you are being discharged the day of surgery, you will not be allowed to drive home. You will need a responsible individual to drive you home and stay with you for 24 hours after surgery.   If you are taking public transportation, you will need to have a responsible individual with you.  After surgery, you can help prevent lung complications by doing breathing exercises.  Take deep breaths and cough every 1-2 hours. Your doctor may order a device called an Incentive Spirometer to help you take deep breaths. When coughing or sneezing, hold a pillow firmly against your incision with both hands. This is called "splinting." Doing this helps protect your incision. It also decreases belly discomfort.  Surgery Visitation Policy:  Patients undergoing a surgery or procedure may have two family members or support persons with them as long as the person is not COVID-19 positive or experiencing its symptoms.   Inpatient Visitation:    Visiting hours  are 7 a.m. to 8 p.m. Up to four visitors are allowed at one time in a patient room. The visitors may rotate out with other people during the day. One designated support person (adult) may remain overnight.  Please call the Pre-admissions Testing Dept. at [phone] if you have any questions about these instructions.     Preparing for Surgery with CHLORHEXIDINE GLUCONATE (CHG)  Soap  Chlorhexidine Gluconate (CHG) Soap  o An antiseptic cleaner that kills germs and bonds with the skin to continue killing germs even after washing  o Used for showering the night before surgery and morning of surgery  Before surgery, you can play an important role by reducing the number of germs on your skin.  CHG (Chlorhexidine gluconate) soap is an antiseptic cleanser which kills germs and bonds with the skin to continue killing germs even after washing.  Please do not use if you have an allergy to CHG or antibacterial soaps. If your skin becomes reddened/irritated stop using the CHG.  1. Shower the NIGHT BEFORE SURGERY and the MORNING OF SURGERY with CHG soap.  2. If you choose to wash your hair, wash your hair first as usual with your normal shampoo.  3. After shampooing, rinse your hair and body thoroughly to remove the shampoo.  4. Use CHG as you would any other liquid soap. You can apply CHG directly to the skin and wash gently with a scrungie or a clean washcloth.  5. Apply the CHG soap to your body only from the neck down. Do not use on open wounds or open sores. Avoid contact with your eyes, ears, mouth, and genitals (private parts). Wash face and genitals (private parts) with your normal soap.  6. Wash thoroughly, paying special attention to the area where your surgery will be performed.  7. Thoroughly rinse your body with warm water.  8. Do not shower/wash with your normal soap after using and rinsing off the CHG soap.  9. Pat yourself dry with a clean towel.  10. Wear clean pajamas to bed the night before surgery.  12. Place clean sheets on your bed the night of your first shower and do not sleep with pets.  13. Shower again with the CHG soap on the day of surgery prior to arriving at the hospital.  14. Do not apply any deodorants/lotions/powders.  15. Please wear clean clothes to the hospital.

## 2022-08-28 ENCOUNTER — Encounter: Payer: Self-pay | Admitting: Urgent Care

## 2022-08-28 ENCOUNTER — Encounter
Admission: RE | Admit: 2022-08-28 | Discharge: 2022-08-28 | Disposition: A | Discharge status: Home / Self Care | Payer: Medicaid Other | Source: Ambulatory Visit | Attending: Obstetrics and Gynecology | Admitting: Obstetrics and Gynecology

## 2022-08-28 DIAGNOSIS — Z3A00 Weeks of gestation of pregnancy not specified: Secondary | ICD-10-CM | POA: Insufficient documentation

## 2022-08-28 DIAGNOSIS — O0993 Supervision of high risk pregnancy, unspecified, third trimester: Secondary | ICD-10-CM | POA: Diagnosis present

## 2022-08-28 LAB — CBC
HCT: 38.4 % (ref 36.0–46.0)
Hemoglobin: 12.2 g/dL (ref 12.0–15.0)
MCH: 26.8 pg (ref 26.0–34.0)
MCHC: 31.8 g/dL (ref 30.0–36.0)
MCV: 84.2 fL (ref 80.0–100.0)
Platelets: 226 10*3/uL (ref 150–400)
RBC: 4.56 MIL/uL (ref 3.87–5.11)
RDW: 16.4 % — ABNORMAL HIGH (ref 11.5–15.5)
WBC: 9.4 10*3/uL (ref 4.0–10.5)
nRBC: 0 % (ref 0.0–0.2)

## 2022-08-28 LAB — HIV ANTIBODY (ROUTINE TESTING W REFLEX): HIV Screen 4th Generation wRfx: NONREACTIVE

## 2022-08-28 LAB — TYPE AND SCREEN
ABO/RH(D): O POS
Antibody Screen: NEGATIVE

## 2022-08-29 LAB — RPR: RPR Ser Ql: NONREACTIVE

## 2022-08-30 MED ORDER — CEFAZOLIN SODIUM-DEXTROSE 2-4 GM/100ML-% IV SOLN
2.0000 g | INTRAVENOUS | Status: AC
  Administered 2022-08-31: 2 g via INTRAVENOUS
  Filled 2022-08-30: qty 100

## 2022-08-30 MED ORDER — LACTATED RINGERS IV SOLN: Freq: Once | INTRAVENOUS | Status: AC

## 2022-08-30 MED ORDER — SOD CITRATE-CITRIC ACID 500-334 MG/5ML PO SOLN: 30.0000 mL | ORAL | Status: AC

## 2022-08-30 MED ORDER — LACTATED RINGERS IV SOLN: INTRAVENOUS | Status: DC

## 2022-08-30 MED ORDER — POVIDONE-IODINE 10 % EX SWAB
2.0000 | Freq: Once | CUTANEOUS | Status: AC
  Administered 2022-08-31: 2 via TOPICAL

## 2022-08-31 ENCOUNTER — Encounter
Admission: RE | Disposition: A | Discharge status: Home / Self Care | Payer: Self-pay | Source: Home / Self Care | Attending: Obstetrics and Gynecology

## 2022-08-31 ENCOUNTER — Encounter: Payer: Self-pay | Admitting: Obstetrics and Gynecology

## 2022-08-31 ENCOUNTER — Inpatient Hospital Stay: Payer: Medicaid Other | Admitting: Anesthesiology

## 2022-08-31 ENCOUNTER — Inpatient Hospital Stay
Admission: RE | Admit: 2022-08-31 | Discharge: 2022-09-02 | DRG: 788 | Disposition: A | Discharge status: Home / Self Care | Payer: Medicaid Other | Attending: Obstetrics and Gynecology | Admitting: Obstetrics and Gynecology

## 2022-08-31 ENCOUNTER — Other Ambulatory Visit: Payer: Self-pay

## 2022-08-31 DIAGNOSIS — O9902 Anemia complicating childbirth: Secondary | ICD-10-CM | POA: Diagnosis present

## 2022-08-31 DIAGNOSIS — O34211 Maternal care for low transverse scar from previous cesarean delivery: Principal | ICD-10-CM | POA: Diagnosis present

## 2022-08-31 DIAGNOSIS — D509 Iron deficiency anemia, unspecified: Secondary | ICD-10-CM | POA: Diagnosis present

## 2022-08-31 DIAGNOSIS — Z3A39 39 weeks gestation of pregnancy: Secondary | ICD-10-CM | POA: Diagnosis not present

## 2022-08-31 DIAGNOSIS — O99214 Obesity complicating childbirth: Secondary | ICD-10-CM | POA: Diagnosis present

## 2022-08-31 DIAGNOSIS — Z23 Encounter for immunization: Secondary | ICD-10-CM

## 2022-08-31 DIAGNOSIS — Z8616 Personal history of COVID-19: Secondary | ICD-10-CM

## 2022-08-31 DIAGNOSIS — O0993 Supervision of high risk pregnancy, unspecified, third trimester: Secondary | ICD-10-CM

## 2022-08-31 LAB — CBC
HCT: 28.8 % — ABNORMAL LOW (ref 36.0–46.0)
Hemoglobin: 9.5 g/dL — ABNORMAL LOW (ref 12.0–15.0)
MCH: 27.3 pg (ref 26.0–34.0)
MCHC: 33 g/dL (ref 30.0–36.0)
MCV: 82.8 fL (ref 80.0–100.0)
Platelets: 252 10*3/uL (ref 150–400)
RBC: 3.48 MIL/uL — ABNORMAL LOW (ref 3.87–5.11)
RDW: 15.9 % — ABNORMAL HIGH (ref 11.5–15.5)
WBC: 10.2 10*3/uL (ref 4.0–10.5)
nRBC: 0 % (ref 0.0–0.2)

## 2022-08-31 LAB — TYPE AND SCREEN
ABO/RH(D): O POS
Antibody Screen: NEGATIVE

## 2022-08-31 SURGERY — Surgical Case
Anesthesia: Spinal

## 2022-08-31 MED ORDER — MISOPROSTOL 200 MCG PO TABS
ORAL_TABLET | ORAL | Status: AC
  Administered 2022-08-31: 800 ug via RECTAL
  Filled 2022-08-31: qty 4

## 2022-08-31 MED ORDER — BISACODYL 10 MG RE SUPP: 10.0000 mg | Freq: Every day | RECTAL | Status: DC | PRN

## 2022-08-31 MED ORDER — LACTATED RINGERS IV SOLN: INTRAVENOUS | Status: DC

## 2022-08-31 MED ORDER — ONDANSETRON HCL 4 MG/2ML IJ SOLN: 4.0000 mg | Freq: Three times a day (TID) | INTRAMUSCULAR | Status: DC | PRN

## 2022-08-31 MED ORDER — KETOROLAC TROMETHAMINE 30 MG/ML IJ SOLN
INTRAMUSCULAR | Status: DC | PRN
  Administered 2022-08-31: 30 mg via INTRAVENOUS

## 2022-08-31 MED ORDER — PHENYLEPHRINE 80 MCG/ML (10ML) SYRINGE FOR IV PUSH (FOR BLOOD PRESSURE SUPPORT)
PREFILLED_SYRINGE | INTRAVENOUS | Status: AC
  Filled 2022-08-31: qty 10

## 2022-08-31 MED ORDER — PHENYLEPHRINE HCL-NACL 20-0.9 MG/250ML-% IV SOLN
INTRAVENOUS | Status: AC
  Filled 2022-08-31: qty 250

## 2022-08-31 MED ORDER — PHENYLEPHRINE HCL-NACL 20-0.9 MG/250ML-% IV SOLN
INTRAVENOUS | Status: DC | PRN
  Administered 2022-08-31: 40 ug/min via INTRAVENOUS

## 2022-08-31 MED ORDER — WITCH HAZEL-GLYCERIN EX PADS: 1.0000 | MEDICATED_PAD | CUTANEOUS | Status: DC | PRN

## 2022-08-31 MED ORDER — SIMETHICONE 80 MG PO CHEW
80.0000 mg | CHEWABLE_TABLET | Freq: Three times a day (TID) | ORAL | Status: DC
  Administered 2022-08-31 – 2022-09-02 (×5): 80 mg via ORAL
  Filled 2022-08-31 (×5): qty 1

## 2022-08-31 MED ORDER — GABAPENTIN 300 MG PO CAPS
300.0000 mg | ORAL_CAPSULE | Freq: Every day | ORAL | Status: DC
  Administered 2022-08-31 – 2022-09-01 (×2): 300 mg via ORAL
  Filled 2022-08-31 (×2): qty 1

## 2022-08-31 MED ORDER — BUPIVACAINE HCL (PF) 0.5 % IJ SOLN
INTRAMUSCULAR | Status: AC
  Filled 2022-08-31: qty 30

## 2022-08-31 MED ORDER — OXYTOCIN-SODIUM CHLORIDE 30-0.9 UT/500ML-% IV SOLN: 2.5000 [IU]/h | INTRAVENOUS | Status: AC

## 2022-08-31 MED ORDER — MENTHOL 3 MG MT LOZG: 1.0000 | LOZENGE | OROMUCOSAL | Status: DC | PRN

## 2022-08-31 MED ORDER — PHENYLEPHRINE HCL (PRESSORS) 10 MG/ML IV SOLN
INTRAVENOUS | Status: DC | PRN
  Administered 2022-08-31 (×2): 160 ug via INTRAVENOUS

## 2022-08-31 MED ORDER — SOD CITRATE-CITRIC ACID 500-334 MG/5ML PO SOLN
ORAL | Status: AC
  Administered 2022-08-31: 30 mL via ORAL
  Filled 2022-08-31: qty 15

## 2022-08-31 MED ORDER — BUPIVACAINE IN DEXTROSE 0.75-8.25 % IT SOLN
INTRATHECAL | Status: DC | PRN
  Administered 2022-08-31: 1.6 mL via INTRATHECAL

## 2022-08-31 MED ORDER — DEXAMETHASONE SODIUM PHOSPHATE 10 MG/ML IJ SOLN
INTRAMUSCULAR | Status: AC
  Filled 2022-08-31: qty 1

## 2022-08-31 MED ORDER — MORPHINE SULFATE (PF) 0.5 MG/ML IJ SOLN
INTRAMUSCULAR | Status: AC
  Filled 2022-08-31: qty 10

## 2022-08-31 MED ORDER — FLEET ENEMA 7-19 GM/118ML RE ENEM: 1.0000 | ENEMA | Freq: Every day | RECTAL | Status: DC | PRN

## 2022-08-31 MED ORDER — TETANUS-DIPHTH-ACELL PERTUSSIS 5-2.5-18.5 LF-MCG/0.5 IM SUSY: 0.5000 mL | PREFILLED_SYRINGE | Freq: Once | INTRAMUSCULAR | Status: DC

## 2022-08-31 MED ORDER — DEXAMETHASONE SODIUM PHOSPHATE 10 MG/ML IJ SOLN
INTRAMUSCULAR | Status: DC | PRN
  Administered 2022-08-31: 10 mg via INTRAVENOUS

## 2022-08-31 MED ORDER — EPHEDRINE 5 MG/ML INJ
INTRAVENOUS | Status: AC
  Filled 2022-08-31: qty 5

## 2022-08-31 MED ORDER — KETOROLAC TROMETHAMINE 30 MG/ML IJ SOLN
30.0000 mg | Freq: Four times a day (QID) | INTRAMUSCULAR | Status: AC
  Administered 2022-08-31 – 2022-09-01 (×4): 30 mg via INTRAVENOUS
  Filled 2022-08-31 (×4): qty 1

## 2022-08-31 MED ORDER — NALOXONE HCL 0.4 MG/ML IJ SOLN: 0.4000 mg | INTRAMUSCULAR | Status: DC | PRN

## 2022-08-31 MED ORDER — METHYLERGONOVINE MALEATE 0.2 MG/ML IJ SOLN
INTRAMUSCULAR | Status: AC
  Filled 2022-08-31: qty 1

## 2022-08-31 MED ORDER — FERROUS SULFATE 325 (65 FE) MG PO TABS
325.0000 mg | ORAL_TABLET | Freq: Two times a day (BID) | ORAL | Status: DC
  Administered 2022-08-31 – 2022-09-02 (×4): 325 mg via ORAL
  Filled 2022-08-31 (×4): qty 1

## 2022-08-31 MED ORDER — OXYTOCIN-SODIUM CHLORIDE 30-0.9 UT/500ML-% IV SOLN
INTRAVENOUS | Status: DC | PRN
  Administered 2022-08-31: 500 mL via INTRAVENOUS

## 2022-08-31 MED ORDER — NALOXONE HCL 4 MG/10ML IJ SOLN: 1.0000 ug/kg/h | INTRAVENOUS | Status: DC | PRN

## 2022-08-31 MED ORDER — ACETAMINOPHEN 500 MG PO TABS
1000.0000 mg | ORAL_TABLET | Freq: Four times a day (QID) | ORAL | Status: DC
  Administered 2022-08-31 – 2022-09-02 (×6): 1000 mg via ORAL
  Filled 2022-08-31 (×6): qty 2

## 2022-08-31 MED ORDER — MORPHINE SULFATE (PF) 0.5 MG/ML IJ SOLN
INTRAMUSCULAR | Status: DC | PRN
  Administered 2022-08-31: .1 mg via EPIDURAL

## 2022-08-31 MED ORDER — METHYLERGONOVINE MALEATE 0.2 MG/ML IJ SOLN
0.2000 mg | Freq: Once | INTRAMUSCULAR | Status: AC
  Administered 2022-08-31: 0.2 mg via INTRAMUSCULAR

## 2022-08-31 MED ORDER — SODIUM CHLORIDE 0.9% FLUSH: 3.0000 mL | INTRAVENOUS | Status: DC | PRN

## 2022-08-31 MED ORDER — KETOROLAC TROMETHAMINE 30 MG/ML IJ SOLN: 30.0000 mg | Freq: Four times a day (QID) | INTRAMUSCULAR | Status: DC | PRN

## 2022-08-31 MED ORDER — DIBUCAINE (PERIANAL) 1 % EX OINT: 1.0000 | TOPICAL_OINTMENT | CUTANEOUS | Status: DC | PRN

## 2022-08-31 MED ORDER — BUPIVACAINE HCL (PF) 0.25 % IJ SOLN
INTRAMUSCULAR | Status: AC
  Filled 2022-08-31: qty 30

## 2022-08-31 MED ORDER — FENTANYL CITRATE (PF) 100 MCG/2ML IJ SOLN
INTRAMUSCULAR | Status: DC | PRN
  Administered 2022-08-31: 15 ug via INTRAVENOUS

## 2022-08-31 MED ORDER — SIMETHICONE 80 MG PO CHEW: 80.0000 mg | CHEWABLE_TABLET | ORAL | Status: DC | PRN

## 2022-08-31 MED ORDER — ONDANSETRON HCL 4 MG/2ML IJ SOLN
INTRAMUSCULAR | Status: DC | PRN
  Administered 2022-08-31: 4 mg via INTRAVENOUS

## 2022-08-31 MED ORDER — ACETAMINOPHEN 500 MG PO TABS
1000.0000 mg | ORAL_TABLET | Freq: Four times a day (QID) | ORAL | Status: DC
  Administered 2022-08-31: 1000 mg via ORAL
  Filled 2022-08-31: qty 2

## 2022-08-31 MED ORDER — 0.9 % SODIUM CHLORIDE (POUR BTL) OPTIME
TOPICAL | Status: DC | PRN
  Administered 2022-08-31: 600 mL

## 2022-08-31 MED ORDER — DIPHENHYDRAMINE HCL 25 MG PO CAPS: 25.0000 mg | ORAL_CAPSULE | Freq: Four times a day (QID) | ORAL | Status: DC | PRN

## 2022-08-31 MED ORDER — MISOPROSTOL 200 MCG PO TABS: 800.0000 ug | ORAL_TABLET | Freq: Once | ORAL | Status: AC

## 2022-08-31 MED ORDER — SENNOSIDES-DOCUSATE SODIUM 8.6-50 MG PO TABS
2.0000 | ORAL_TABLET | Freq: Every day | ORAL | Status: DC
  Administered 2022-08-31 – 2022-09-01 (×2): 2 via ORAL
  Filled 2022-08-31 (×3): qty 2

## 2022-08-31 MED ORDER — BUPIVACAINE HCL (PF) 0.5 % IJ SOLN
INTRAMUSCULAR | Status: DC | PRN
  Administered 2022-08-31: 30 mL

## 2022-08-31 MED ORDER — IBUPROFEN 600 MG PO TABS
600.0000 mg | ORAL_TABLET | Freq: Four times a day (QID) | ORAL | Status: DC
  Administered 2022-09-01 – 2022-09-02 (×4): 600 mg via ORAL
  Filled 2022-08-31 (×5): qty 1

## 2022-08-31 MED ORDER — FENTANYL CITRATE (PF) 100 MCG/2ML IJ SOLN
INTRAMUSCULAR | Status: AC
  Filled 2022-08-31: qty 2

## 2022-08-31 MED ORDER — SCOPOLAMINE 1 MG/3DAYS TD PT72
1.0000 | MEDICATED_PATCH | Freq: Once | TRANSDERMAL | Status: DC
  Administered 2022-08-31: 1.5 mg via TRANSDERMAL
  Filled 2022-08-31: qty 1

## 2022-08-31 MED ORDER — OXYTOCIN-SODIUM CHLORIDE 30-0.9 UT/500ML-% IV SOLN
INTRAVENOUS | Status: AC
  Filled 2022-08-31: qty 1000

## 2022-08-31 MED ORDER — SODIUM CHLORIDE 0.9% FLUSH
INTRAVENOUS | Status: DC | PRN
  Administered 2022-08-31: 30 mL

## 2022-08-31 MED ORDER — MEASLES, MUMPS & RUBELLA VAC IJ SOLR
0.5000 mL | Freq: Once | INTRAMUSCULAR | Status: DC
  Filled 2022-08-31: qty 0.5

## 2022-08-31 MED ORDER — PRENATAL MULTIVITAMIN CH
1.0000 | ORAL_TABLET | Freq: Every day | ORAL | Status: DC
  Administered 2022-09-01 – 2022-09-02 (×2): 1 via ORAL
  Filled 2022-08-31 (×2): qty 1

## 2022-08-31 MED ORDER — LIDOCAINE HCL (PF) 1 % IJ SOLN
INTRAMUSCULAR | Status: DC | PRN
  Administered 2022-08-31: 2 mL via SUBCUTANEOUS

## 2022-08-31 MED ORDER — ONDANSETRON HCL 4 MG/2ML IJ SOLN
INTRAMUSCULAR | Status: AC
  Filled 2022-08-31: qty 2

## 2022-08-31 MED ORDER — OXYCODONE HCL 5 MG PO TABS
5.0000 mg | ORAL_TABLET | ORAL | Status: DC | PRN
  Administered 2022-09-01 – 2022-09-02 (×2): 5 mg via ORAL
  Filled 2022-08-31 (×2): qty 1
  Filled 2022-08-31: qty 2

## 2022-08-31 MED ORDER — DIPHENHYDRAMINE HCL 25 MG PO CAPS: 25.0000 mg | ORAL_CAPSULE | ORAL | Status: DC | PRN

## 2022-08-31 MED ORDER — OXYCODONE HCL 5 MG PO TABS: 5.0000 mg | ORAL_TABLET | Freq: Four times a day (QID) | ORAL | Status: DC | PRN

## 2022-08-31 MED ORDER — DIPHENHYDRAMINE HCL 50 MG/ML IJ SOLN: 12.5000 mg | INTRAMUSCULAR | Status: DC | PRN

## 2022-08-31 MED ORDER — KETOROLAC TROMETHAMINE 30 MG/ML IJ SOLN
INTRAMUSCULAR | Status: AC
  Filled 2022-08-31: qty 1

## 2022-08-31 MED ORDER — COCONUT OIL OIL: 1.0000 | TOPICAL_OIL | Status: DC | PRN

## 2022-08-31 SURGICAL SUPPLY — 38 items
APL PRP STRL LF DISP 70% ISPRP (MISCELLANEOUS) ×1
BARRIER ADHS 3X4 INTERCEED (GAUZE/BANDAGES/DRESSINGS) IMPLANT
BNDG TENSOPLAST 6X5 (GAUZE/BANDAGES/DRESSINGS) IMPLANT
BRR ADH 4X3 ABS CNTRL BYND (GAUZE/BANDAGES/DRESSINGS) ×1
CELL SAVER LIPIGURD (MISCELLANEOUS) ×1 IMPLANT
CHLORAPREP W/TINT 26 (MISCELLANEOUS) ×1 IMPLANT
DEVICE RETRIEVAL ALEXIS 14 (MISCELLANEOUS) IMPLANT
DRSG CURAFIL 4X4 STRL (GAUZE/BANDAGES/DRESSINGS) IMPLANT
DRSG TELFA 3X8 NADH STRL (GAUZE/BANDAGES/DRESSINGS) ×1 IMPLANT
ELECT REM PT RETURN 9FT ADLT (ELECTROSURGICAL) ×1
ELECTRODE REM PT RTRN 9FT ADLT (ELECTROSURGICAL) ×1 IMPLANT
EXTRACTOR VACUUM KIWI (MISCELLANEOUS) IMPLANT
EXTRT SYSTEM ALEXIS 14CM (MISCELLANEOUS) ×1
GAUZE CURAFIL 4X4 (GAUZE/BANDAGES/DRESSINGS) IMPLANT
GAUZE SPONGE 4X4 12PLY STRL (GAUZE/BANDAGES/DRESSINGS) ×1 IMPLANT
GOWN STRL REUS W/ TWL LRG LVL3 (GOWN DISPOSABLE) ×3 IMPLANT
GOWN STRL REUS W/TWL LRG LVL3 (GOWN DISPOSABLE) ×3
MANIFOLD NEPTUNE II (INSTRUMENTS) ×1 IMPLANT
MAT PREVALON FULL STRYKER (MISCELLANEOUS) ×1 IMPLANT
NDL HYPO 25GX1X1/2 BEV (NEEDLE) ×1 IMPLANT
NEEDLE HYPO 25GX1X1/2 BEV (NEEDLE) ×1 IMPLANT
NS IRRIG 1000ML POUR BTL (IV SOLUTION) ×1 IMPLANT
PACK C SECTION AR (MISCELLANEOUS) ×1 IMPLANT
PAD OB MATERNITY 4.3X12.25 (PERSONAL CARE ITEMS) ×1 IMPLANT
PAD PREP OB/GYN DISP 24X41 (PERSONAL CARE ITEMS) ×1 IMPLANT
SCRUB CHG 4% DYNA-HEX 4OZ (MISCELLANEOUS) ×1 IMPLANT
SUT MNCRL 4-0 (SUTURE) ×1
SUT MNCRL 4-0 27XMFL (SUTURE) ×1
SUT VIC AB 0 CT1 36 (SUTURE) ×2 IMPLANT
SUT VIC AB 0 CTX 36 (SUTURE) ×2
SUT VIC AB 0 CTX36XBRD ANBCTRL (SUTURE) ×2 IMPLANT
SUT VIC AB 2-0 SH 27 (SUTURE) ×2
SUT VIC AB 2-0 SH 27XBRD (SUTURE) ×2 IMPLANT
SUT VICRYL 3-0 27IN SH (SUTURE) IMPLANT
SUTURE MNCRL 4-0 27XMF (SUTURE) ×1 IMPLANT
SYR 30ML LL (SYRINGE) ×2 IMPLANT
TRAP FLUID SMOKE EVACUATOR (MISCELLANEOUS) ×1 IMPLANT
WATER STERILE IRR 500ML POUR (IV SOLUTION) ×1 IMPLANT
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
## 2022-08-31 NOTE — Anesthesia Preprocedure Evaluation (Signed)
Anesthesia Evaluation  Patient identified by MRN, date of birth, ID band Patient awake    Reviewed: Allergy & Precautions, H&P , NPO status , Patient's Chart, lab work & pertinent test results  Airway Mallampati: II  TM Distance: >3 FB Neck ROM: full    Dental no notable dental hx.    Pulmonary neg pulmonary ROS   Pulmonary exam normal        Cardiovascular Exercise Tolerance: Good negative cardio ROS  Rhythm:Regular Rate:Tachycardia     Neuro/Psych  PSYCHIATRIC DISORDERS Anxiety        GI/Hepatic negative GI ROS,,,  Endo/Other    Renal/GU   negative genitourinary   Musculoskeletal   Abdominal Normal abdominal exam  (+)   Peds  Hematology  (+) Blood dyscrasia, anemia   Anesthesia Other Findings Prior CS, macrosomia  Past Medical History: No date: Anxiety No date: Bipolar 1 disorder (HCC) No date: COVID-19 No date: Medical history non-contributory  Past Surgical History: 08/02/2020: CESAREAN SECTION     Comment:  Procedure: CESAREAN SECTION;  Surgeon: Christeen Douglas,              MD;  Location: [facility] ORS;  Service: Obstetrics;; No date: TONSILLECTOMY  BMI    Body Mass Index: 36.64 kg/m      Reproductive/Obstetrics (+) Pregnancy                              Anesthesia Physical Anesthesia Plan  ASA: 2  Anesthesia Plan: Spinal   Post-op Pain Management:    Induction: Intravenous  PONV Risk Score and Plan: Ondansetron  Airway Management Planned:   Additional Equipment:   Intra-op Plan:   Post-operative Plan:   Informed Consent: I have reviewed the patients History and Physical, chart, labs and discussed the procedure including the risks, benefits and alternatives for the proposed anesthesia with the patient or authorized representative who has indicated his/her understanding and acceptance.       Plan Discussed with: Anesthesiologist and CRNA  Anesthesia  Plan Comments:          Anesthesia Quick Evaluation

## 2022-08-31 NOTE — Op Note (Addendum)
Cesarean Section Procedure Note  Date of procedure: 08/31/2022   Pre-operative Diagnosis: Intrauterine pregnancy at [redacted]w[redacted]d;  - prior cesarean section - Body mass index is 36.64 kg/m.  Post-operative Diagnosis: same, delivered.  Procedure: Repeat Low Transverse Cesarean Section through Pfannenstiel incision  Surgeon: Christeen Douglas, MD  Assistant(s):  Margaretmary Eddy, CNM   An experienced assistant was required given the standard of surgical care given the complexity of the case.  This assistant was needed for exposure, dissection, suctioning, retraction, instrument exchange,  CNM assisting with delivery with administration of fundal pressure, and for overall help during the procedure.   Anesthesia: Spinal anesthesia  Anesthesiologist: No responsible provider has been recorded for the case. Anesthesiologist: Yevette Edwards, MD CRNA: Malva Cogan, CRNA Student Nurse Anesthetist: Rolena Infante, RN  Estimated Blood Loss:           Drains: Foley         Total IV Fluids: see anesthesia  Urine Output: see anesthesia          Specimens: cord blood         Complications:  None; patient tolerated the procedure well.         Disposition: PACU - hemodynamically stable.         Condition: stable  Findings:  A female infant "Braelyn" in cephalic presentation. Amniotic fluid - Clear  Birth weight 3890 g.   APGAR (1 MIN): 9   APGAR (5 MINS): 9   APGAR (10 MINS):     Intact placenta with a three-vessel cord.  Grossly normal uterus, tubes and ovaries bilaterally. Dense intraabdominal adhesions were noted.  Procedure Details  The patient was taken to Operating Room, identified as the correct patient and the procedure verified as C-Section Delivery. A formal Time Out was held with all team members present and in agreement.  After induction of anesthesia, the patient was draped and prepped in the usual sterile manner. A Pfannenstiel skin incision was made and carried down  through the subcutaneous tissue to the fascia. Fascial incision was made and extended transversely with the Mayo scissors. The fascia was separated from the underlying rectus tissue superiorly and inferiorly. The peritoneum was identified and entered sharply in layers due to adhesions. Peritoneal incision was extended longitudinally.   A low transverse hysterotomy was made. The fetus was delivered after 2 minutes and a kiwi, as fundal pressure was not enough to effect delivery. The umbilical cord was clamped x2 and cut and the infant was handed to the awaiting pediatricians. The placenta was removed intact and appeared normal, intact, and with a 3-vessel cord.   The uterus was exteriorized and cleared of all clot and debris. The hysterotomy was closed with running sutures of 0-Vicryl. A second imbricating layer was placed with the same suture. Excellent hemostasis was observed. The peritoneal cavity was cleared of all clots and debris. The uterus was returned to the abdomen.   Gutters and pelvis were evaluated and excellent hemostasis was noted. The fascia was then reapproximated with running sutures of 0 Maxon.  The subcutaneous tissue was reapproximated with running sutures of 0 Vicryl. The skin was reapproximated with Ensorb absorbable staples. 30 of 0.5% bupivicaine in 30ml of NSS was placed in the fascial and skin lines.  Instrument, sponge, and needle counts were correct prior to the abdominal closure and at the conclusion of the case.   The patient tolerated the procedure well and was transferred to the recovery room in stable condition.  Christeen Douglas, MD 08/31/2022

## 2022-08-31 NOTE — Anesthesia Procedure Notes (Signed)
Spinal  Patient location during procedure: OR Start time: 08/31/2022 10:25 AM End time: 08/31/2022 10:31 AM Reason for block: surgical anesthesia Staffing Performed: resident/CRNA  Anesthesiologist: Yevette Edwards, MD Resident/CRNA: Malva Cogan, CRNA Other anesthesia staff: Rolena Infante, RN Performed by: Malva Cogan, CRNA Authorized by: Yevette Edwards, MD   Preanesthetic Checklist Completed: patient identified, IV checked, site marked, risks and benefits discussed, surgical consent, monitors and equipment checked, pre-op evaluation and timeout performed Spinal Block Patient position: sitting Prep: Betadine Patient monitoring: heart rate, continuous pulse ox, blood pressure and cardiac monitor Approach: midline Location: L4-5 Injection technique: single-shot Needle Needle type: Whitacre and Introducer  Needle gauge: 24 G Needle length: 9 cm Assessment Events: CSF return Additional Notes Negative paresthesia. Negative blood return. Positive free-flowing CSF. Expiration date of kit checked and confirmed. Patient tolerated procedure well, without complications.

## 2022-08-31 NOTE — Lactation Note (Signed)
This note was copied from a baby's chart. Lactation Consultation Note  Patient Name: Boy Milo Siegle ZOXWR'[id number] Date: 08/31/2022 Age:25 hours Reason for consult: Follow-up assessment;Term   Maternal Data Has patient been taught Hand Expression?: Yes Does the patient have breastfeeding experience prior to this delivery?: Yes How long did the patient breastfeed?: 1 yr  Feeding Mother's Current Feeding Choice: Breast Milk Assisted mom with changing wet diaper which awakened baby , mom offered left breast in modified cradle and baby latched easily with audible swallows. LATCH Score Latch: Grasps breast easily, tongue down, lips flanged, rhythmical sucking.  Audible Swallowing: Spontaneous and intermittent  Type of Nipple: Everted at rest and after stimulation  Comfort (Breast/Nipple): Soft / non-tender  Hold (Positioning): No assistance needed to correctly position infant at breast.  LATCH Score: 10   Lactation Tools Discussed/Used  Questions about offering bottle as 1st child wouldn't take a bottle when going to daycare, may start offering bottle once breastfeeding well, 1-2 wks after delivery, per mom's judgement   Interventions Interventions: Education  Discharge Pump: Personal WIC Program: Yes  Consult Status Consult Status: PRN    Dyann Kief 08/31/2022, 6:29 PM

## 2022-08-31 NOTE — Transfer of Care (Addendum)
Immediate Anesthesia Transfer of Care Note  Patient: Belinda Weber  Procedure(s) Performed: REPEAT CESAREAN SECTION  Patient Location: PACU  Anesthesia Type:Spinal  Level of Consciousness: awake, alert , and oriented  Airway & Oxygen Therapy: Patient Spontanous Breathing  Post-op Assessment: Report given to RN and Post -op Vital signs reviewed and stable  Post vital signs: Reviewed and stable  Last Vitals:  Vitals Value Taken Time  BP 153/90   Temp 97   Pulse 83   Resp 14   SpO2 100     Last Pain:  Vitals:   08/31/22 0805  TempSrc: Oral  PainSc: 0-No pain         Complications: No notable events documented.
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
## 2022-08-31 NOTE — Progress Notes (Signed)
Pt arrived for scheduled repeat C/S.  

## 2022-08-31 NOTE — Lactation Note (Signed)
This note was copied from a baby's chart. Lactation Consultation Note  Patient Name: Boy Gussie Peralta [id number] Date: 08/31/2022 Age:25 hours Reason for consult: Initial assessment;Term   Maternal Data Has patient been taught Hand Expression?: Yes Does the patient have breastfeeding experience prior to this delivery?: Yes How long did the patient breastfeed?: 1 yr  Feeding Mother's Current Feeding Choice: Breast Milk I have visited mom x 2 and each time baby had recently breastfed, mom states baby latches easily and Transition nurse agrees, mom states she hears swallows, encouraged breastfeeding baby 8x/24 hr or q 2-3hrs.   LATCH Score Latch:  (I did not observe a feeding, baby had recently breastfed)  Audible Swallowing: Spontaneous and intermittent  Type of Nipple: Everted at rest and after stimulation  Comfort (Breast/Nipple): Soft / non-tender  Hold (Positioning): No assistance needed to correctly position infant at breast.  LATCH Score: 10   Lactation Tools Discussed/Used   Interventions Interventions: Education [facility] name and no written on white board in pt room Discharge  Pump: Personal WIC Program: Yes  Consult Status Consult Status: PRN    Dyann Kief 08/31/2022, 4:19 PM

## 2022-09-01 ENCOUNTER — Encounter: Payer: Self-pay | Admitting: Obstetrics and Gynecology

## 2022-09-01 LAB — CBC
HCT: 30.1 % — ABNORMAL LOW (ref 36.0–46.0)
Hemoglobin: 9.5 g/dL — ABNORMAL LOW (ref 12.0–15.0)
MCH: 27.1 pg (ref 26.0–34.0)
MCHC: 31.6 g/dL (ref 30.0–36.0)
MCV: 85.8 fL (ref 80.0–100.0)
Platelets: 175 10*3/uL (ref 150–400)
RBC: 3.51 MIL/uL — ABNORMAL LOW (ref 3.87–5.11)
RDW: 16.1 % — ABNORMAL HIGH (ref 11.5–15.5)
WBC: 11 10*3/uL — ABNORMAL HIGH (ref 4.0–10.5)
nRBC: 0 % (ref 0.0–0.2)

## 2022-09-01 NOTE — Lactation Note (Signed)
This note was copied from a baby's chart. Lactation Consultation Note  Patient Name: Belinda Weber [id number] Date: 09/01/2022 Age:25 hours Reason for consult: Initial assessment;Term  Lactation to the room for initial visit. Baby was asleep in bassinet. Parents stated baby last fed 1.5 hrs ago. Mother states baby is latching and feeding well.  Encouraged feeding on demand and with cues. If baby is not cueing encouraged hand expression and skin to skin.  Encouraged 8 or more attempts in the first 24 hours and 8 or more good feeds after 24 HOL. Reviewed appropriate diapers for days of life and How to know your baby is getting enough to eat. Reviewed "Understanding Postpartum and Newborn Care" booklet at bedside. [facility] # left on board, encouraged to call for any assistance. Mother has no further questions at this time.   Maternal Data Has patient been taught Hand Expression?: Yes Does the patient have breastfeeding experience prior to this delivery?: Yes How long did the patient breastfeed?: 1 year  Feeding Mother's Current Feeding Choice: Breast Milk  Interventions Interventions: Breast feeding basics reviewed;Education  Discharge Discharge Education: Engorgement and breast care;Warning signs for feeding baby Pump: Personal (has a momcozy)  Consult Status Consult Status: PRN    Dossie Swor D Shronda Boeh 09/01/2022, 12:13 PM

## 2022-09-01 NOTE — Anesthesia Postprocedure Evaluation (Signed)
Anesthesia Post Note  Patient: Belinda Weber  Procedure(s) Performed: REPEAT CESAREAN SECTION  Patient location during evaluation: PACU Anesthesia Type: Spinal Level of consciousness: oriented and awake and alert Pain management: pain level controlled Vital Signs Assessment: post-procedure vital signs reviewed and stable Respiratory status: spontaneous breathing, respiratory function stable and patient connected to nasal cannula oxygen Cardiovascular status: blood pressure returned to baseline and stable Postop Assessment: no headache, no backache and no apparent nausea or vomiting Anesthetic complications: no   No notable events documented.   Last Vitals:  Vitals:   09/01/22 0000 09/01/22 0300  BP:  111/66  Pulse: 91 73  Resp:  18  Temp:  36.6 C  SpO2: 94% 96%    Last Pain:  Vitals:   09/01/22 0300  TempSrc: Oral  PainSc: Asleep                 Yevette Edwards

## 2022-09-01 NOTE — Progress Notes (Addendum)
Post Partum Day 1 Subjective: Doing well, no complaints.  Tolerating regular diet, pain with PO meds, got dizzy last night when attempting to stand, so has not stood yet today or voided yet. Foley catheter in place.  No CP SOB Fever,Chills, N/V or leg pain; denies nipple or breast pain, no HA change of vision, RUQ/epigastric pain  Objective: BP 115/68 (BP Location: Right Arm)   Pulse 89   Temp 98.3 F (36.8 C) (Oral)   Resp 20   Ht 5\' 6"  (1.676 m)   Wt 103 kg   LMP 10/28/2021   SpO2 99%   Breastfeeding Yes   BMI 36.64 kg/m    Physical Exam:  General: NAD Breasts: soft/nontender CV: RRR Pulm: nl effort, CTABL Abdomen: soft, NT, BS x 4 Incision: Dsg CDI/pressure dressing in place/no erythema or drainage Lochia: moderate Uterine Fundus: fundus firm and 1 fb below umbilicus DVT Evaluation: no cords, ttp LEs   Recent Labs    08/31/22 0807 09/01/22 0543  HGB 9.5* 9.5*  HCT 28.8* 30.1*  WBC 10.2 11.0*  PLT 252 175    Assessment/Plan: 27 y.o. [id number] postpartum day # 1  - Continue routine PP care, remove Foley and ambulate today. - Lactation consult PRN.  - Discussed contraceptive options including implant, IUDs hormonal and non-hormonal, injection, pills/ring/patch, condoms, and NFP.  - Iron deficiency anemia, clinically insignificant - hemoglobin changed from 9.5 to 9.5, patient is asymptomatic, hemodynamically stable; start po ferrous sulfate BID with stool softeners  - Immunization status: all Imms up to date  Disposition: Does not desire Dc home today.   Janyce Llanos, CNM 09/01/2022 8:21 AM

## 2022-09-02 MED ORDER — WITCH HAZEL-GLYCERIN EX PADS: 1.0000 | MEDICATED_PAD | CUTANEOUS | 12 refills | Status: AC | PRN

## 2022-09-02 MED ORDER — DIBUCAINE (PERIANAL) 1 % EX OINT: 1.0000 | TOPICAL_OINTMENT | CUTANEOUS | Status: AC | PRN

## 2022-09-02 MED ORDER — VARICELLA VIRUS VACCINE LIVE 1350 PFU/0.5ML IJ SUSR
0.5000 mL | Freq: Once | INTRAMUSCULAR | Status: AC
  Administered 2022-09-02: 0.5 mL via SUBCUTANEOUS
  Filled 2022-09-02 (×2): qty 0.5

## 2022-09-02 MED ORDER — ACETAMINOPHEN 500 MG PO TABS: 1000.0000 mg | ORAL_TABLET | Freq: Four times a day (QID) | ORAL | 0 refills | Status: AC

## 2022-09-02 MED ORDER — ACETAMINOPHEN 500 MG PO TABS
1000.0000 mg | ORAL_TABLET | Freq: Four times a day (QID) | ORAL | Status: DC
  Administered 2022-09-02: 1000 mg via ORAL
  Filled 2022-09-02: qty 2

## 2022-09-02 MED ORDER — SENNOSIDES-DOCUSATE SODIUM 8.6-50 MG PO TABS: 2.0000 | ORAL_TABLET | Freq: Every day | ORAL | Status: AC

## 2022-09-02 MED ORDER — IBUPROFEN 600 MG PO TABS: 600.0000 mg | ORAL_TABLET | Freq: Four times a day (QID) | ORAL | 0 refills | Status: AC

## 2022-09-02 MED ORDER — COCONUT OIL OIL: 1.0000 | TOPICAL_OIL | 0 refills | Status: AC | PRN

## 2022-09-02 MED ORDER — OXYCODONE HCL 5 MG PO TABS: 5.0000 mg | ORAL_TABLET | ORAL | 0 refills | Status: AC | PRN

## 2022-09-02 MED ORDER — SIMETHICONE 80 MG PO CHEW: 80.0000 mg | CHEWABLE_TABLET | ORAL | 0 refills | Status: AC | PRN

## 2022-09-02 MED ORDER — FERROUS SULFATE 325 (65 FE) MG PO TABS: 325.0000 mg | ORAL_TABLET | Freq: Two times a day (BID) | ORAL | 3 refills | Status: AC

## 2022-09-02 NOTE — Discharge Instructions (Signed)

## 2022-09-08 ENCOUNTER — Telehealth: Payer: Self-pay

## 2022-09-08 NOTE — Telephone Encounter (Signed)
[facility]- Discharge Call Backs-Left Voicemail about the following below. 1-Do you have any questions or concerns about yourself as you heal? 2-Any concerns or questions about your baby? 3- Reviewed ABC's of safe sleep. 4-How was your stay at the hospital? 5- Did our team work together to care for you? You should be receiving a survey in the mail soon.   We would really appreciate it if you could fill that out for us and return it in the mail.  We value the feedback to make improvements and continue the great work we do.   If you have any questions please feel free to call me back at [phone]  

## 2022-11-10 ENCOUNTER — Ambulatory Visit: Payer: Self-pay

## 2022-11-10 NOTE — Lactation Note (Addendum)
This note was copied from a baby's chart. Lactation Consultation Note  Patient Name: Belinda Weber [id number] Date: 11/10/2022 Age:25 m.o. Reason for consult: Follow-up assessment;Term;Breastfeeding assistance;Mother's request   Maternal Data This is mom's 2nd baby, C/S vacuum assisted on 08/31/22. Mom with a history of obesity. Mom is an experienced breastfeeding mother. Baby was born at 51 4/7 weeks at 3890 grams. At discharge baby was breastfeeding well every 2-3 hours with a 4 % weight loss. Baby at 2 week visit was in the 39th percentile at 3884 grams just 6 grams less than birth weight. At subsequent pediatric visits baby continued to gain weight and according to chart review rate of gain had slowed some. Mom had received guidance regarding her fast flow of milk with a recommendation to recline during feeds.  Per mom's report she feels this baby has had issues with a shallow latch from outset. She reports she did not experience latch issues with her first baby. Mom reports baby has also has spit up immediately after feeding when she burps the baby whether the baby breastfeeds or bottle feeds. Per mom she burps after the baby completes feeding on 1 breast. When bottle feeding she burps when the baby completes a feeding. Mom reports baby is gassy with feeding. 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 Per mom the baby currently feeds first am feeding at the breast(around 7 am),then at daycare baby receives 3(sometimes 4) bottles of 4-5 ounces per feeding in a 6 hour period, breastfeeds 3 times in the evening with the last feeding at 11 pm for a total of 7 feeds with some days 8 feeds when the baby takes an additional bottle at daycare.Per mom when she bottle feeds baby with the lansinoh bottle system the baby takes a complete feed in nore more than 20 minutes however when at daycare some bottle feeds per care  givers take more than 1 hour for baby to complete taking the full bottle(5 ounces are in the bottle). Mom pumps 1 time when at work, expresses 10 ounces of milk and then she post pumps after each evening breastfeeding(3 times) and accumulates 10 ounces at each pump session for a total of 40 ounces of milk. Mom realizes she has an oversupply of milk but she is accumulating milk as she wants to make sure she has enough breastmilk so she doesn't have to use formula. Mom uses the mom cozies pump system and pumps for 30 minutes each session. 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   Has patient been taught Hand Expression?: Yes Does the patient have breastfeeding experience prior to this delivery?: Yes How long did the patient breastfeed?: 1 year  Feeding Mother's Current Feeding Choice: Breast Milk Today's weight baby was 4962 grams(mom changed a very wet and large bowel movement diaper before putting the baby on the scale(last BM 4 days ago). Diaper weighed 72 grams and was not calculated into this weight. Preweight was done with Medela baby weigh breastfeeding scale.   Mom positioned baby in cradle hold. Mom attempted to latch baby and immediately had a forceful let-down with milk spraying a good 12 -15 inches. The milk appeared to slow some and mom latched baby. Baby with audible "hard" loud swallows detached from the breast and  milk was noted leaking from his mouth  with the pillow he was resting on very wet from the milk he was letting leak out the side of his mouth when he was attempting to breastfeed. LC did during this time recommended mom lean back and keep baby's head elevated. Baby although showing cues and wanting to feed would detach and cry. A #24 mm nipple shield introduced so baby could pace himself at the breast and more easily adjust his latch to slow the flow without detaching.  Baby settled and breastfed with use of the shield. Appeared to mom and LC as more comfortable. By pre/post weight baby transferred a total of 86 ml's in 25 minutes and was content after feeding. During the feeding mom recommended several times to frequently burp baby. Baby had 1 small spit up , per mom this was much improved  from baby burping at the end of feed and spitting up larger amounts. Baby appears to have a tongue tie however baby does extend his tongue over the gumline and is able to elevate his tongue.  LATCH Score Latch: Grasps breast easily, tongue down, lips flanged, rhythmical sucking. (Baby can latch well but slides down on nipple or detaches because mom have overactive let-down and oversupply.)  Audible Swallowing: Spontaneous and intermittent ("hard swallows" gulping) Improved with nipple shield.  Type of Nipple: Everted at rest and after stimulation  Comfort (Breast/Nipple): Soft / non-tender  Hold (Positioning): Assistance needed to correctly position infant at breast and maintain latch.  LATCH Score: 9   Lactation Tools Discussed/Used Tools: Nipple Shields Nipple shield size: 24  Interventions Interventions: Assisted with latch;Education (nipple shield) Plan provided and reviewed with mom: -Offer baby to eat at least 8 times in 24 hours when baby shows feeding cues like hands to mouth, light sleep, or fussiness. -Follow baby's cues for fullness. If when bottle feeding the baby closes his lips or backs away from the nipple when you offer the bottle burp the baby, and burp mid feed as well. If after the baby burps he does the same thing when presenting the bottle then he is done. -Frequently burp the baby throughout the feeding. If he is detaching from the breast or bottle, squirming/arching during the feed, or detaching and crying burp the baby before continuing the feed. -When latching the baby to the breast use the #24 mm nipple shield to help the baby with the fast  flow of milk especially at the beginning of the breastfeeding. Once the flow of milk appears to slow after the first few minutes you could try offering the baby to breast without the shield if he appears agreeable to try and stay latched. -A breastfeeding may take 10-40 minutes per feeding session and will vary though out the day. Today baby took 91 ml's in 25 minutes with the nipple shield. -Feedings from start to finish generally take a baby 30-40 minutes maximum when bottle feeding. -When breastfeeding avoiding leaning over the baby as this is the fastest flow. Leaning back when breastfeeding or side lying can be helpful to naturally slow the flow. When positioning baby to lay across in cradle hold make sure to keep baby's head elevated. -Baby 's will take 2-5 ounces per feeding with an average of 25 ounces in 24 hours. Some babies will take a little more some will take a little less. -Today's weight baby was 4962 grams(mom changed a very large bowel movement(last BM 4 days ago) -Try to come for a weight check  in 1-2 weeks from today.  Also, discussed: -with mom as she is over producing and pumps 5 ounces per breast to consider offering baby to feed at 1 breast per feeding session to ensure baby gets full composition of foremilk and hind milk. Discussed mom could pump the opposite breast using her hands free mom cozie. This may then help regulate some of mom's oversupply issue as mom post pumps both breasts after breastfeeding. Discussed this could optimize the feeding and her time spent post pumping. Mom verbalized she will consider this option. -reviewed with mom average weight gain for baby is about 25-30 grams a day, baby's take an average of 25 ounces of milk in 24 hours taking at each feeding 2-5 ounces per feeding with at least 8 feeds in 24 hours and importance of following baby's cues for satiety. Mom to discuss with daycare provider following baby's cues for feeding. Recommended with  initiation of nipple shield for mom to return for a weight check for baby in 1-2 weeks. As mom has difficulty taking off from work [facility] agreeable to do weight check on a Saturday or Sunday.  Discharge Discharge Education: Outpatient recommendation;Other (comment) (Recommended follow-up for weight check with new use of nipple shield.) Pump: Personal (mom cozies) Mom verbalized understanding and is in agreement with plan.  Consult Status Consult Status: Complete   Fuller Song 11/10/2022, 4:59 PM

## 2022-12-22 ENCOUNTER — Other Ambulatory Visit: Payer: Self-pay | Admitting: Otolaryngology

## 2022-12-22 DIAGNOSIS — H9203 Otalgia, bilateral: Secondary | ICD-10-CM

## 2022-12-29 ENCOUNTER — Inpatient Hospital Stay
Admission: RE | Admit: 2022-12-29 | Discharge: 2022-12-29 | Disposition: A | Discharge status: Home / Self Care | Payer: Medicaid Other | Source: Ambulatory Visit | Attending: Otolaryngology | Admitting: Otolaryngology

## 2022-12-29 DIAGNOSIS — H9203 Otalgia, bilateral: Secondary | ICD-10-CM

## 2023-04-23 IMAGING — CT CT ABD-PELV W/ CM
2 of 4 series · 16 of 46 positions shown, 18 images · IV contrast (omnipaque)
Comparison: None.

CLINICAL DATA: Abdominal pain, LEFT-sided since [REDACTED]

EXAM:
CT ABDOMEN AND PELVIS WITH CONTRAST
TECHNIQUE: Multidetector CT imaging of the abdomen and pelvis was performed
using the standard protocol following bolus administration of
intravenous contrast.
CONTRAST:  100mL OMNIPAQUE IOHEXOL 350 MG/ML SOLN

[Series 2: abd pelvis 5.00 · axial · 0.65mm/px · z∈[-1504,-1089]mm · 13 of 87 slices shown, 15 images]
[im 4/87  soft-tissue]
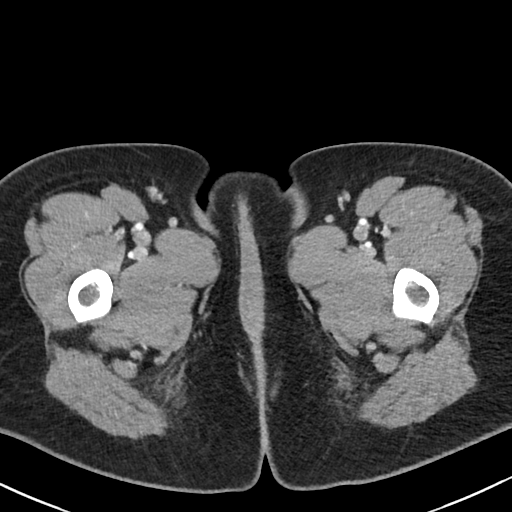
[im 4/87  bone]
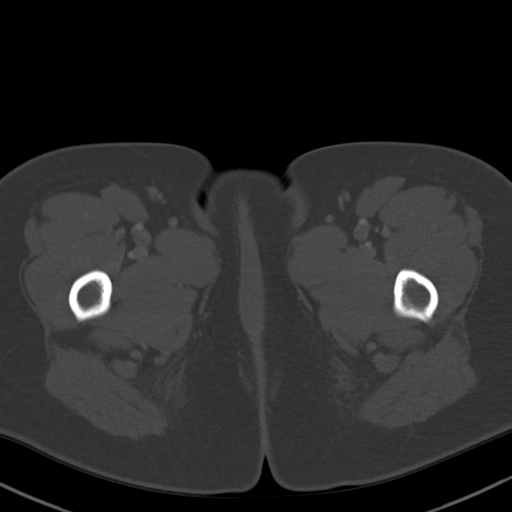
[im 12/87  soft-tissue]
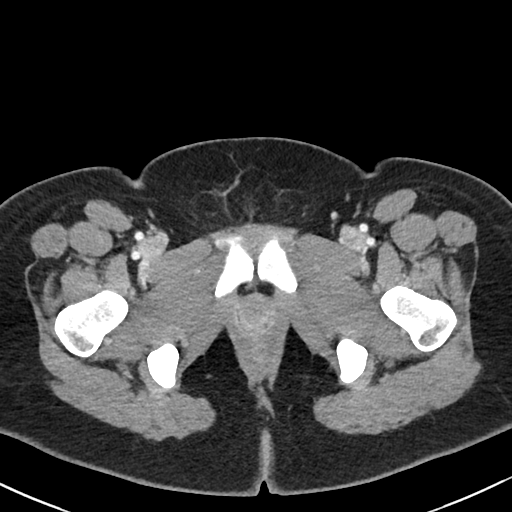
[im 19/87  soft-tissue]
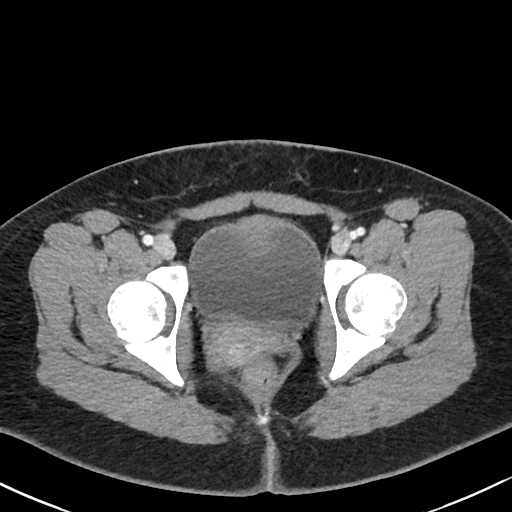
[im 23/87  soft-tissue]
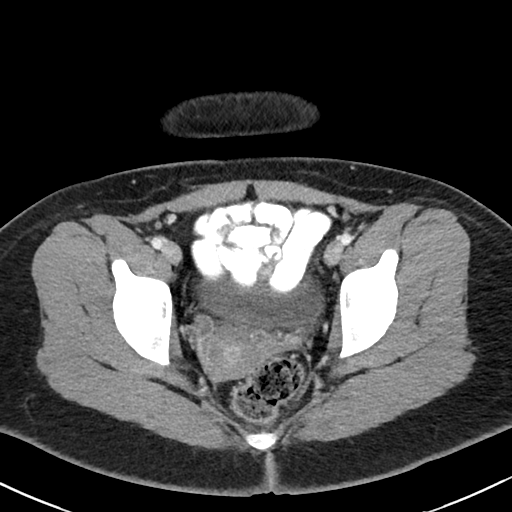
[im 30/87  soft-tissue]
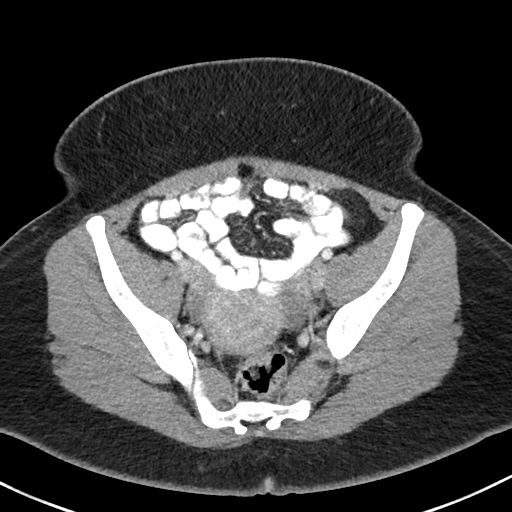
[im 38/87  soft-tissue]
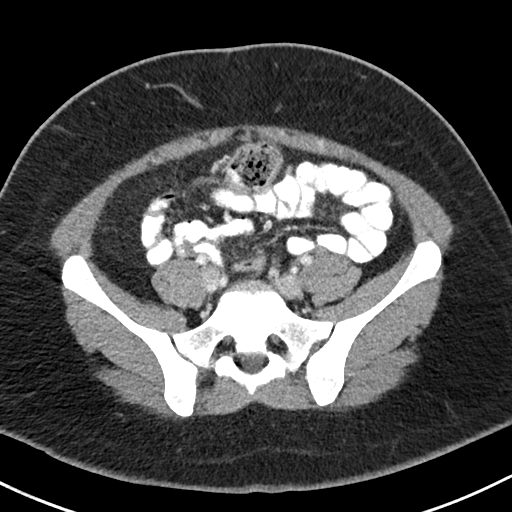
[im 45/87  soft-tissue]
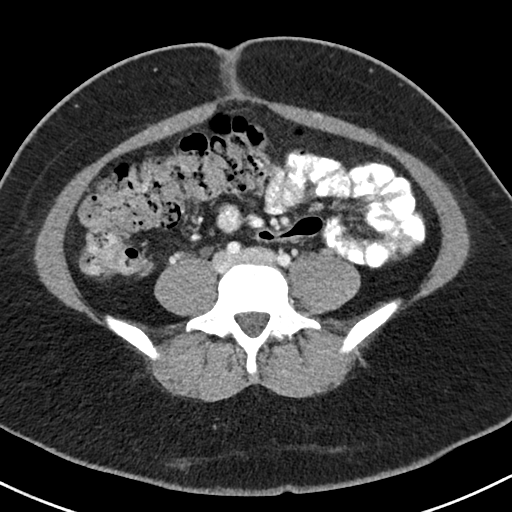
[im 49/87  soft-tissue]
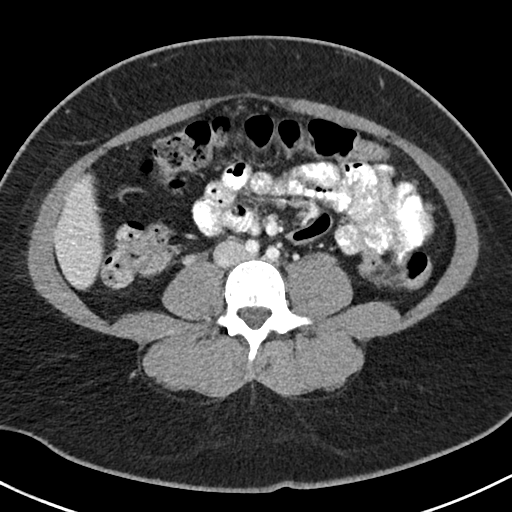
[im 57/87  soft-tissue]
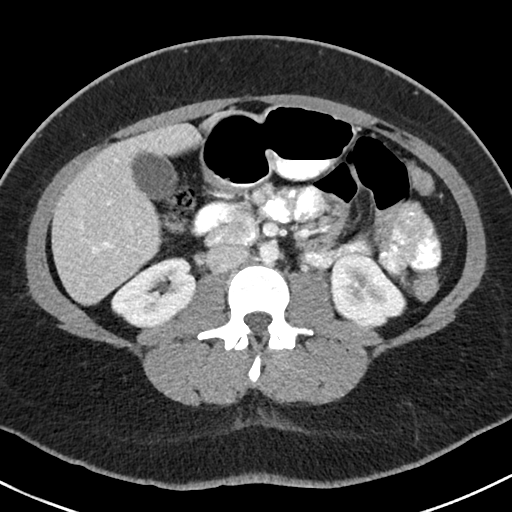
[im 57/87  bone]
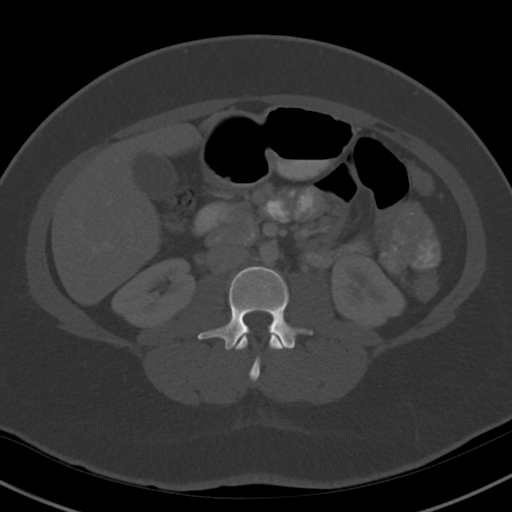
[im 64/87  soft-tissue]
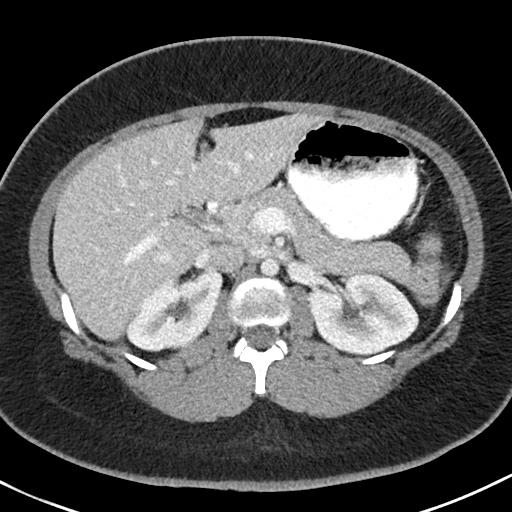
[im 68/87  soft-tissue]
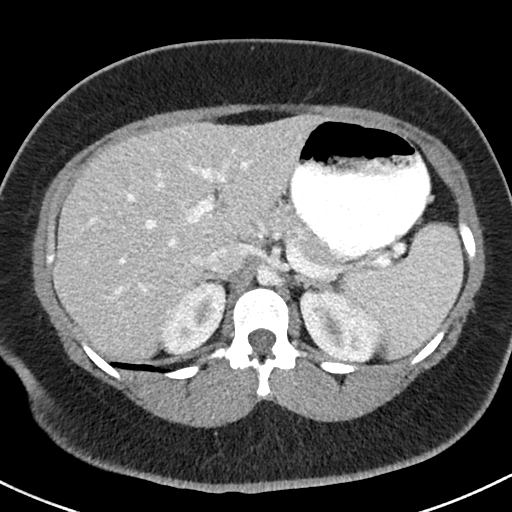
[im 75/87  soft-tissue]
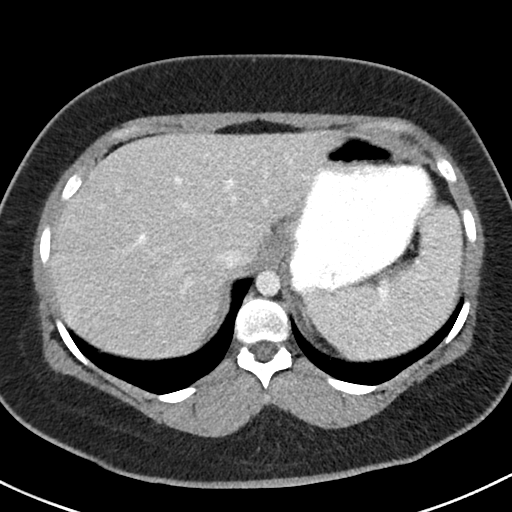
[im 83/87  soft-tissue]
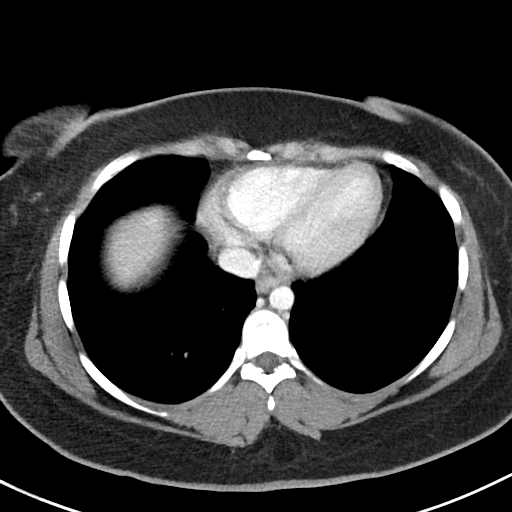

[Series 4: coronals abd pelvis 2.00 cor · coronal · 0.65mm/px · 3 of 150 slices shown]
[im 50/150  soft-tissue]
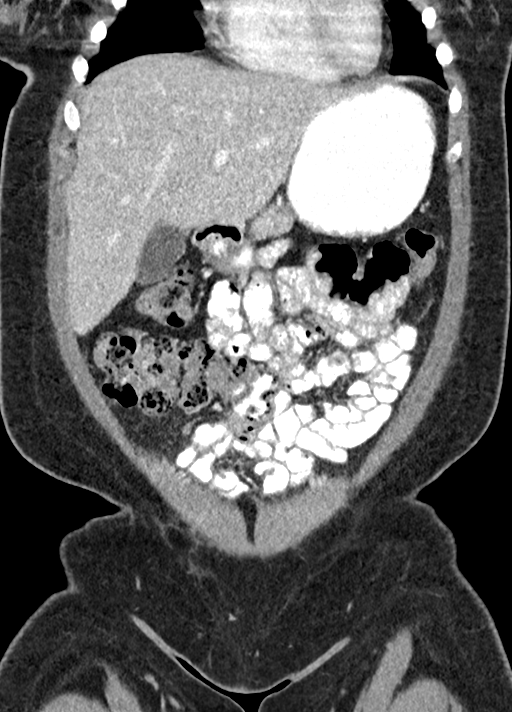
[im 67/150  soft-tissue]
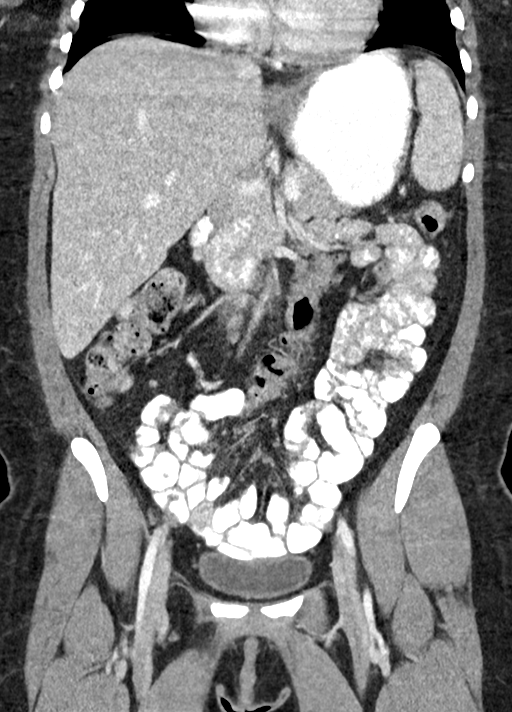
[im 83/150  soft-tissue]
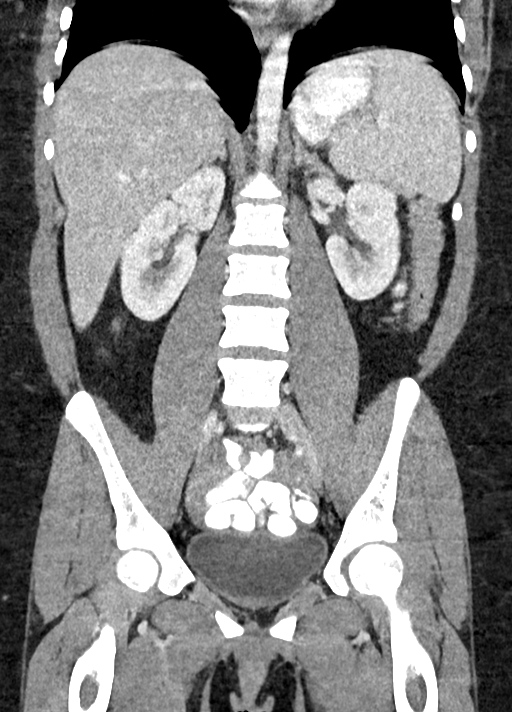

[16 of 46 positions shown; findings below may reference images not displayed]

FINDINGS: Lower chest: No acute abnormality.

Hepatobiliary: Focal fatty deposition adjacent to the falciform
ligament. Gallbladder is unremarkable. No intrahepatic or
extrahepatic biliary ductal dilation.

Pancreas: Unremarkable. No pancreatic ductal dilatation or
surrounding inflammatory changes.

Spleen: Normal in size without focal abnormality.

Adrenals/Urinary Tract: Adrenal glands are unremarkable. Kidneys are
normal, without renal calculi, focal lesion, or hydronephrosis.
Bladder is unremarkable.

Stomach/Bowel: Stomach is within normal limits. Appendix appears
normal. No evidence of bowel wall thickening, distention, or
inflammatory changes.

Vascular/Lymphatic: No significant vascular findings are present. No
enlarged abdominal or pelvic lymph nodes.

Reproductive: Uterus and bilateral adnexa are unremarkable.

Other: No abdominal wall hernia or abnormality. No abdominopelvic
ascites.

Musculoskeletal: No acute or significant osseous findings.
IMPRESSION: No CT etiology for abdominal pain identified.
